# Patient Record
Sex: Male | Born: 1995 | Hispanic: No | Marital: Single | Smoking: Never smoker
Health system: Southern US, Community
[De-identification: ages and names within clinical notes are randomized; demographics above are authoritative.]

## PROBLEM LIST (undated history)

## (undated) DIAGNOSIS — Z789 Other specified health status: Secondary | ICD-10-CM

## (undated) HISTORY — PX: NO PAST SURGERIES: SHX2092

## (undated) HISTORY — DX: Other specified health status: Z78.9

---
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
## 2016-01-18 ENCOUNTER — Ambulatory Visit (INDEPENDENT_AMBULATORY_CARE_PROVIDER_SITE_OTHER): Admitting: Physician Assistant

## 2016-01-18 ENCOUNTER — Encounter (INDEPENDENT_AMBULATORY_CARE_PROVIDER_SITE_OTHER): Payer: Self-pay | Admitting: Physician Assistant

## 2016-01-18 VITALS — BP 147/91 | HR 78 | Temp 97.5°F | Resp 20 | Ht 71.0 in | Wt 180.0 lb

## 2016-01-18 DIAGNOSIS — R030 Elevated blood-pressure reading, without diagnosis of hypertension: Secondary | ICD-10-CM

## 2016-01-18 DIAGNOSIS — J029 Acute pharyngitis, unspecified: Secondary | ICD-10-CM

## 2016-01-18 DIAGNOSIS — J069 Acute upper respiratory infection, unspecified: Secondary | ICD-10-CM

## 2016-01-18 LAB — POCT RAPID STREP A: Rapid Strep A Screen POCT: NEGATIVE

## 2016-01-18 NOTE — Patient Instructions (Addendum)
Self-Care for Sore Throats  Sore throats happen for many reasons, such as colds, allergies, and infections caused by viruses or bacteria. In any case, your throat becomes red and sore. Your goal for self-care is to reduce your discomfort while giving your throat a chance to heal.    Moisten and soothe your throat  Tips include the following:   Try a sip of water first thing after waking up.   Keep your throat moist by drinking6 or more glasses of clear liquids every day.   Run a cool-air humidifier in your room overnight.   Avoid cigarette smoke.   Suck on throat lozenges, cough drops, hard candy, ice chips, or frozen fruit-juice bars. Use the sugar-free versions if your diet or medical condition requires them.  Gargle to ease irritation  Gargling every hour or2 can ease irritation. Try gargling with1 of these solutions:   1/4teaspoon of salt in1/2 cup of warm water   An over-the-counter anesthetic gargle  Use medicine for more relief  Over-the-counter medicine can reduce sore throat symptoms. Ask your pharmacist if you have questions about which medicine to use:   Ease pain with anesthetic sprays. Aspirin or an aspirin substitute also helps. Remember, never give aspirin to anyone 18 or younger, or if you are alreadytaking blood thinners.   For sore throats caused by allergies, try antihistamines to block the allergic reaction.   Remember: unless a sore throat is caused by a bacterial infection, antibiotics won't help you.  Prevent future sore throats  Prevention tips include the following:   Stop smoking or reduce contact with secondhand smoke. Smoke irritates the tender throat lining.   Limit contact with pets and with allergy-causing substances, such as pollen and mold.   When you're around someone with a sore throat or cold, wash your hands often to keep viruses or bacteria from spreading.   Don't strain your vocal cords.  Call your healthcare provider  Contact your healthcare provider if  you have:   A temperature over 101F (38.3C)   White spots on the throat   Great difficulty swallowing   Trouble breathing   A skin rash   Recent exposure to someone else with strep bacteria   Severe hoarseness and swollen glands in the neck or jaw   Date Last Reviewed: 09/19/2014   2000-2016 [employer] [address], [place] [address]. All rights reserved. This information is not intended as a substitute for professional medical care. Always follow your healthcare professional's instructions.

## 2016-01-18 NOTE — Progress Notes (Signed)
Subjective:    Patient ID: Alan Palmer is a 20 y.o. male.    URI    This is a new problem. The current episode started in the past 7 days (3 days). The problem has been gradually worsening. There has been no fever (subjective fever, "hot"). Associated symptoms include congestion, coughing, rhinorrhea and a sore throat. Pertinent negatives include no chest pain, diarrhea, rash, vomiting or wheezing.       The following portions of the patient's history were reviewed and updated as appropriate: allergies, current medications, past medical history, past social history, past surgical history and problem list.    Review of Systems   Constitutional: Positive for activity change and fatigue. Negative for fever.   HENT: Positive for congestion, rhinorrhea and sore throat.    Respiratory: Positive for cough. Negative for shortness of breath and wheezing.    Cardiovascular: Negative for chest pain.   Gastrointestinal: Negative for diarrhea and vomiting.   Skin: Negative for rash.   All other systems reviewed and are negative.        Objective:    BP (!) 147/91   Pulse 78   Temp 97.5 F (36.4 C) (Oral)   Resp 20   Ht 1.803 m (5\' 11" )   Wt 81.6 kg (180 lb)   BMI 25.10 kg/m   BP elevated, reviewed, not felt to need urgent management, f/u PCP.    Physical Exam   Constitutional: He is oriented to person, place, and time. He appears well-developed and well-nourished. No distress.   HENT:   Head: Normocephalic and atraumatic.   Right Ear: Tympanic membrane normal.   Left Ear: Tympanic membrane normal.   Nose: Rhinorrhea present.   Mouth/Throat: Uvula is midline and mucous membranes are normal. Posterior oropharyngeal erythema present.   Eyes: EOM are normal. Right eye exhibits no discharge. Left eye exhibits no discharge.   Neck: Normal range of motion.   Cardiovascular: Normal rate, regular rhythm and normal heart sounds.  Exam reveals no gallop and no friction rub.    No murmur heard.  Pulmonary/Chest: Effort  normal and breath sounds normal. No respiratory distress. He has no decreased breath sounds. He has no wheezes. He has no rhonchi. He has no rales.   Musculoskeletal: Normal range of motion.   Lymphadenopathy:     He has no cervical adenopathy.   Neurological: He is alert and oriented to person, place, and time.   Skin: Skin is warm and dry. No rash noted.   Psychiatric: He has a normal mood and affect. His behavior is normal. Judgment and thought content normal.   Nursing note and vitals reviewed.          Results     Procedure Component Value Units Date/Time    POCT RAPID STREP A [[id number]]  (Normal) Collected:  01/18/16 1744    Specimen:  Throat Updated:  01/18/16 1753     POCT QC Pass     Rapid Strep A Screen POCT Negative      Comment Negative Results should be confirmed by throat Cx to confirm absence of Strep A inf.          Assessment and Plan:       Alan Palmer was seen today for uri.    Diagnoses and all orders for this visit:    Upper respiratory tract infection, unspecified type    Pharyngitis, unspecified etiology  -     POCT RAPID STREP A    Elevated blood  pressure reading    Plan: Rapid strep negative. Clinical picture negative for strep. Supportive care- Ibuprofen PRN inflammation/pain if not contraindicated, throat lozenges, chloraseptic throat spray, warm liquids, popsicles. Wash hands frequently, stay out of peoples' faces, cover cough with corner of elbow.  Discussed ER precautions with patient/guardian. Do not hesitate to go to ER with worsening, severe symptoms. Follow up with PCP or RTC if there are any new or worsening symptoms or if the symptoms are lasting longer than expected.  Patient/guardian expressed understanding and agreement with plan of care at time of discharge.  Patient Instructions       Self-Care for Sore Throats  Sore throats happen for many reasons, such as colds, allergies, and infections caused by viruses or bacteria. In any case, your throat becomes red and sore. Your goal  for self-care is to reduce your discomfort while giving your throat a chance to heal.    Moisten and soothe your throat  Tips include the following:   Try a sip of water first thing after waking up.   Keep your throat moist by drinking6 or more glasses of clear liquids every day.   Run a cool-air humidifier in your room overnight.   Avoid cigarette smoke.   Suck on throat lozenges, cough drops, hard candy, ice chips, or frozen fruit-juice bars. Use the sugar-free versions if your diet or medical condition requires them.  Gargle to ease irritation  Gargling every hour or2 can ease irritation. Try gargling with1 of these solutions:   1/4teaspoon of salt in1/2 cup of warm water   An over-the-counter anesthetic gargle  Use medicine for more relief  Over-the-counter medicine can reduce sore throat symptoms. Ask your pharmacist if you have questions about which medicine to use:   Ease pain with anesthetic sprays. Aspirin or an aspirin substitute also helps. Remember, never give aspirin to anyone 86 or younger, or if you are alreadytaking blood thinners.   For sore throats caused by allergies, try antihistamines to block the allergic reaction.   Remember: unless a sore throat is caused by a bacterial infection, antibiotics won't help you.  Prevent future sore throats  Prevention tips include the following:   Stop smoking or reduce contact with secondhand smoke. Smoke irritates the tender throat lining.   Limit contact with pets and with allergy-causing substances, such as pollen and mold.   When you're around someone with a sore throat or cold, wash your hands often to keep viruses or bacteria from spreading.   Don't strain your vocal cords.  Call your healthcare provider  Contact your healthcare provider if you have:   A temperature over 101F (38.3C)   White spots on the throat   Great difficulty swallowing   Trouble breathing   A skin rash   Recent exposure to someone else with strep  bacteria   Severe hoarseness and swollen glands in the neck or jaw   Date Last Reviewed: 09/19/2014   2000-2016 [employer] [address], [place], [place] [address]. All rights reserved. This information is not intended as a substitute for professional medical care. Always follow your healthcare professional's instructions.                  Vinson Moselle, PA  [facility]  01/18/2016  6:11 PM

## 2016-01-20 ENCOUNTER — Encounter: Payer: Self-pay | Admitting: Emergency Medicine

## 2016-01-20 ENCOUNTER — Emergency Department

## 2016-01-20 ENCOUNTER — Emergency Department
Admission: EM | Admit: 2016-01-20 | Discharge: 2016-01-20 | Disposition: A | Discharge status: Home / Self Care | Attending: Student in an Organized Health Care Education/Training Program | Admitting: Student in an Organized Health Care Education/Training Program

## 2016-01-20 DIAGNOSIS — Y999 Unspecified external cause status: Secondary | ICD-10-CM | POA: Insufficient documentation

## 2016-01-20 DIAGNOSIS — W1839XA Other fall on same level, initial encounter: Secondary | ICD-10-CM | POA: Diagnosis not present

## 2016-01-20 DIAGNOSIS — S53124A Posterior dislocation of right ulnohumeral joint, initial encounter: Secondary | ICD-10-CM

## 2016-01-20 DIAGNOSIS — Y929 Unspecified place or not applicable: Secondary | ICD-10-CM | POA: Diagnosis not present

## 2016-01-20 DIAGNOSIS — S59901A Unspecified injury of right elbow, initial encounter: Secondary | ICD-10-CM | POA: Diagnosis present

## 2016-01-20 DIAGNOSIS — Y9363 Activity, rugby: Secondary | ICD-10-CM | POA: Diagnosis not present

## 2016-01-20 MED ORDER — FENTANYL CITRATE (PF) 100 MCG/2ML IJ SOLN
INTRAMUSCULAR | Status: AC
  Filled 2016-01-20: qty 2

## 2016-01-20 MED ORDER — PROPOFOL 10 MG/ML IV BOLUS: 100.0000 mg | Freq: Once | INTRAVENOUS | Status: DC

## 2016-01-20 MED ORDER — MORPHINE SULFATE (PF) 4 MG/ML IV SOLN
4.0000 mg | INTRAVENOUS | Status: DC | PRN
  Administered 2016-01-20: 4 mg via INTRAVENOUS
  Filled 2016-01-20: qty 1

## 2016-01-20 MED ORDER — MORPHINE SULFATE (PF) 4 MG/ML IV SOLN
4.0000 mg | Freq: Once | INTRAVENOUS | Status: AC
  Administered 2016-01-20: 4 mg via INTRAVENOUS

## 2016-01-20 MED ORDER — HYDROCODONE-ACETAMINOPHEN 5-325 MG PO TABS: 1.0000 | ORAL_TABLET | Freq: Four times a day (QID) | ORAL | 0 refills | Status: AC | PRN

## 2016-01-20 MED ORDER — KETAMINE HCL 10 MG/ML IJ SOLN: 100.0000 mg | Freq: Once | INTRAMUSCULAR | Status: DC

## 2016-01-20 MED ORDER — KETAMINE HCL 10 MG/ML IJ SOLN
INTRAMUSCULAR | Status: AC | PRN
  Administered 2016-01-20: 50 mg via INTRAVENOUS

## 2016-01-20 MED ORDER — KETAMINE HCL 10 MG/ML IJ SOLN
0.3000 mg/kg | Freq: Once | INTRAMUSCULAR | Status: AC
  Administered 2016-01-20: 24 mg via INTRAVENOUS

## 2016-01-20 MED ORDER — PROPOFOL 10 MG/ML IV BOLUS
INTRAVENOUS | Status: AC | PRN
  Administered 2016-01-20: 50 mg via INTRAVENOUS

## 2016-01-20 MED ORDER — FENTANYL CITRATE (PF) 100 MCG/2ML IJ SOLN
100.0000 ug | INTRAMUSCULAR | Status: DC | PRN
  Administered 2016-01-20: 100 ug via INTRAVENOUS

## 2016-01-20 MED ORDER — KETAMINE HCL 10 MG/ML IJ SOLN
INTRAMUSCULAR | Status: AC
  Administered 2016-01-20: 24 mg via INTRAVENOUS
  Filled 2016-01-20: qty 1

## 2016-01-20 MED ORDER — PROPOFOL 1000 MG/100ML IV EMUL
INTRAVENOUS | Status: AC
  Filled 2016-01-20: qty 100

## 2016-01-20 MED ORDER — SODIUM CHLORIDE 0.9 % IV SOLN
INTRAVENOUS | Status: AC | PRN
  Administered 2016-01-20: 125 mL/h via INTRAVENOUS

## 2016-01-20 MED ORDER — MORPHINE SULFATE (PF) 4 MG/ML IV SOLN
INTRAVENOUS | Status: AC
  Filled 2016-01-20: qty 1

## 2016-01-20 NOTE — ED Provider Notes (Signed)
[facility] Emergency Department Provider Note    First MD Initiated Contact with Patient 01/20/16 1515     (approximate)  I have reviewed the triage vital signs and the nursing notes.   HISTORY  Chief Complaint Elbow Injury    HPI Newt Lukesatrick Blais is a 20 y.o. male  who presents with chief complaint of acute right elbow pain sustained while playing rugby. Patient states he fell onto the his right elbow after he was trying to break a tackle. Patient was assessed by trainer on the field was told that his elbow is dislocated. They attempted in the field were unsuccessful. Patient denies any numbness or tingling. He is able to move all 5 digits of his right hand. He is right-hand-dominant. No other associated traumatic injuries.   History reviewed. No pertinent past medical history. No family h/o bleeding disorders History reviewed. No pertinent surgical history. There are no active problems to display for this patient.     Prior to Admission medications   Medication Sig Start Date End Date Taking? Authorizing Provider  HYDROcodone-acetaminophen (NORCO) 5-325 MG tablet Take 1 tablet by mouth every 6 (six) hours as needed for moderate pain. 01/20/16   Willy EddyPatrick Cristy Colmenares, MD    Allergies Fish allergy; Peanut-containing drug products; and Shellfish allergy    Social History Social History  Substance Use Topics  . Smoking status: Never Smoker  . Smokeless tobacco: Never Used  . Alcohol use No    Review of Systems Patient denies headaches, rhinorrhea, blurry vision, numbness, shortness of breath, chest pain, edema, cough, abdominal pain, nausea, vomiting, diarrhea, dysuria, fevers, rashes or hallucinations unless otherwise stated above in HPI. ____________________________________________   PHYSICAL EXAM:  VITAL SIGNS: Vitals:   01/20/16 1630 01/20/16 1726  BP: 137/80 (!) 144/82  Pulse: 77 72  Resp: 18 20  Temp:      Constitutional: Alert and  oriented.Eyes: Conjunctivae are normal. PERRL. EOMI. Head: Atraumatic. Nose: No congestion/rhinnorhea. Mouth/Throat: Mucous membranes are moist.  Oropharynx non-erythematous. Neck: No stridor. Painless ROM. No cervical spine tenderness to palpation Hematological/Lymphatic/Immunilogical: No cervical lymphadenopathy. Cardiovascular: Normal rate, regular rhythm. Grossly normal heart sounds.  Good peripheral circulation. Respiratory: Normal respiratory effort.  No retractions. Lungs CTAB. Gastrointestinal: Soft and nontender. No distention. No abdominal bruits. No CVA tenderness. Musculoskeletal: No lower extremity tenderness nor edema.  No joint effusions.  RUE with obvious deformity and swelling to right elbow.  SILT distally.  Neuro intact in radial, ulnar and median nerve distrubition,.  Strong radial pulse Neurologic:  Normal speech and language. No gross focal neurologic deficits are appreciated. No gait instability. Skin:  Skin is warm, dry and intact. No rash noted. Psychiatric: Mood and affect are normal. Speech and behavior are normal.  ____________________________________________   LABS (all labs ordered are listed, but only abnormal results are displayed)  No results found for this or any previous visit (from the past 24 hour(s)). ____________________________________________  EKG ____________________________________________  RADIOLOGY  I personally reviewed all radiographic images ordered to evaluate for the above acute complaints and reviewed radiology reports and findings.  These findings were personally discussed with the patient.  Please see medical record for radiology report. ____________________________________________   PROCEDURES  Procedure(s) performed: none .Sedation Date/Time: 01/20/2016 4:19 PM Performed by: Willy EddyOBINSON, Saladin Authorized by: Willy EddyOBINSON, Saeed   Consent:    Consent obtained:  Written   Consent given by:  Patient Indications:    Procedure  performed:  Dislocation reduction   Procedure necessitating sedation performed by:  Physician performing sedation   Intended level of sedation:  Moderate (conscious sedation) Pre-sedation assessment:    NPO status caution: urgency dictates proceeding with non-ideal NPO status     ASA classification: class 1 - normal, healthy patient     Neck mobility: normal     Mouth opening:  3 or more finger widths   Thyromental distance:  4 finger widths   Mallampati score:  I - soft palate, uvula, fauces, pillars visible   History of difficult intubation: no   Immediate pre-procedure details:    Reviewed: vital signs, relevant labs/tests and NPO status     Verified: bag valve mask available, emergency equipment available, intubation equipment available, IV patency confirmed, oxygen available, reversal medications available and suction available   Procedure details (see MAR for exact dosages):    Preoxygenation:  Nasal cannula   Sedation: ketofol. Post-procedure details:    Attendance: Constant attendance by certified staff until patient recovered     Recovery: Patient returned to pre-procedure baseline     Patient is stable for discharge or admission: yes     Patient tolerance:  Tolerated well, no immediate complications ORTHOPEDIC INJURY TREATMENT Date/Time: 01/20/2016 4:21 PM Performed by: Willy Eddy Authorized by: Willy Eddy  Consent: Verbal consent obtained. Consent given by: patient Patient understanding: patient states understanding of the procedure being performed Patient consent: the patient's understanding of the procedure matches consent given Procedure consent: procedure consent matches procedure scheduled Relevant documents: relevant documents present and verified Site marked: the operative site was marked Imaging studies: imaging studies available Required items: required blood products, implants, devices, and special equipment available Patient identity confirmed:  arm band Injury location: elbow Location details: right elbow Injury type: dislocation Pre-procedure neurovascular assessment: neurovascularly intact Pre-procedure distal perfusion: normal Pre-procedure neurological function: normal Pre-procedure range of motion: reduced  Anesthesia: Local anesthesia used: no  Sedation: Patient sedated: yes Manipulation performed: yes Reduction method: traction and counter traction, supination and manipulation of proximal ulna Reduction successful: yes X-ray confirmed reduction: yes Immobilization: splint and sling Post-procedure neurovascular assessment: post-procedure neurovascularly intact Post-procedure distal perfusion: normal Post-procedure neurological function: normal Post-procedure range of motion: normal Patient tolerance: Patient tolerated the procedure well with no immediate complications       Critical Care performed: no ____________________________________________   INITIAL IMPRESSION / ASSESSMENT AND PLAN / ED COURSE  Pertinent labs & imaging results that were available during my care of the patient were reviewed by me and considered in my medical decision making (see chart for details).  DDX: fracutre, dislocation, sprain  Othmar Ringer is a 20 y.o. who presents to the ED with acute deformity and pain to the right elbow after her rugby injury. Patient otherwise afebrile hemodynamically stable.  Remainder of trauma secondary survey shows no evidence and similar associated traumatic injury. X-ray of the right elbow shows posterior dislocation. Plan procedural sedation with reduction.   Clinical Course as of Jan 20 1756  Sat Jan 20, 2016  1642 POst reduction films show appropriate anatomic alignment.  Patient has a monitor post sedation and is currently stable for discharge home. He is tolerating oral hydration. Remains in no acute distress.  Have discussed with the patient and available family all diagnostics and  treatments performed thus far and all questions were answered to the best of my ability. The patient demonstrates understanding and agreement with plan.   [PR]    Clinical Course User Index [PR] Willy Eddy, MD     ____________________________________________   FINAL CLINICAL  IMPRESSION(S) / ED DIAGNOSES  Final diagnoses:  Closed traumatic dislocation of posterior elbow joint, right, initial encounter      NEW MEDICATIONS STARTED DURING THIS VISIT:  Discharge Medication List as of 01/20/2016  4:46 PM    START taking these medications   Details  HYDROcodone-acetaminophen (NORCO) 5-325 MG tablet Take 1 tablet by mouth every 6 (six) hours as needed for moderate pain., Starting Sat 01/20/2016, Print         Note:  This document was prepared using Dragon voice recognition software and may include unintentional dictation errors.    Willy EddyPatrick Allaya Abbasi, MD 01/20/16 [phone]

## 2016-01-20 NOTE — Sedation Documentation (Signed)
Patient awake and alert interacting with parents appropriately. VSS. NAD noted. Will continue to monitor.

## 2016-01-20 NOTE — Discharge Instructions (Signed)
Keep splint on until seen by your physician or the orthopedic physician As directed. Elevate for swelling and pain. Keep splint material dry. Take medications as needed for pain. Return for severe intractable pain or other concerns. ° °

## 2016-01-20 NOTE — Sedation Documentation (Signed)
Family updated as to patient's status.

## 2016-01-20 NOTE — Sedation Documentation (Signed)
MD completed reduction. Splinting elbow at this time.

## 2016-01-20 NOTE — ED Triage Notes (Signed)
While playing rugby, patient fell and injured right elbow.  Seen by trainer at the field and elbow attempted to reduce right elbow (elbow dislocated) attempt unsuccessful, sent to ED.

## 2016-01-22 ENCOUNTER — Telehealth (INDEPENDENT_AMBULATORY_CARE_PROVIDER_SITE_OTHER): Payer: Self-pay

## 2016-01-22 NOTE — Telephone Encounter (Signed)
Mailbox is full - unable to leave a message. 

## 2016-03-28 ENCOUNTER — Ambulatory Visit
Admission: RE | Admit: 2016-03-28 | Discharge: 2016-03-28 | Disposition: A | Discharge status: Home / Self Care | Source: Ambulatory Visit | Attending: Physician Assistant | Admitting: Physician Assistant

## 2016-03-28 DIAGNOSIS — M25621 Stiffness of right elbow, not elsewhere classified: Secondary | ICD-10-CM | POA: Insufficient documentation

## 2016-03-28 DIAGNOSIS — S43004D Unspecified dislocation of right shoulder joint, subsequent encounter: Secondary | ICD-10-CM | POA: Insufficient documentation

## 2016-03-28 DIAGNOSIS — M25521 Pain in right elbow: Secondary | ICD-10-CM | POA: Insufficient documentation

## 2016-04-18 ENCOUNTER — Ambulatory Visit
Admission: RE | Admit: 2016-04-18 | Discharge: 2016-04-18 | Disposition: A | Discharge status: Home / Self Care | Source: Ambulatory Visit | Attending: Physician Assistant | Admitting: Physician Assistant

## 2016-04-18 DIAGNOSIS — M25621 Stiffness of right elbow, not elsewhere classified: Secondary | ICD-10-CM | POA: Insufficient documentation

## 2016-04-18 DIAGNOSIS — M25521 Pain in right elbow: Secondary | ICD-10-CM | POA: Insufficient documentation

## 2016-04-18 DIAGNOSIS — S53104D Unspecified dislocation of right ulnohumeral joint, subsequent encounter: Secondary | ICD-10-CM | POA: Insufficient documentation

## 2016-05-19 ENCOUNTER — Ambulatory Visit

## 2017-05-23 ENCOUNTER — Encounter (INDEPENDENT_AMBULATORY_CARE_PROVIDER_SITE_OTHER): Payer: Self-pay

## 2017-05-23 ENCOUNTER — Ambulatory Visit (INDEPENDENT_AMBULATORY_CARE_PROVIDER_SITE_OTHER)
Admission: RE | Admit: 2017-05-23 | Discharge: 2017-05-23 | Disposition: A | Discharge status: Home / Self Care | Source: Ambulatory Visit | Attending: Family Medicine | Admitting: Family Medicine

## 2017-05-23 ENCOUNTER — Ambulatory Visit (INDEPENDENT_AMBULATORY_CARE_PROVIDER_SITE_OTHER): Admitting: Family Medicine

## 2017-05-23 VITALS — BP 147/81 | HR 62 | Temp 98.6°F | Resp 16 | Ht 71.0 in | Wt 180.0 lb

## 2017-05-23 DIAGNOSIS — Y9363 Activity, rugby: Secondary | ICD-10-CM

## 2017-05-23 DIAGNOSIS — S90211A Contusion of right great toe with damage to nail, initial encounter: Secondary | ICD-10-CM

## 2017-05-23 DIAGNOSIS — W500XXA Accidental hit or strike by another person, initial encounter: Secondary | ICD-10-CM

## 2017-05-23 DIAGNOSIS — Y998 Other external cause status: Secondary | ICD-10-CM

## 2017-05-23 DIAGNOSIS — S99921A Unspecified injury of right foot, initial encounter: Secondary | ICD-10-CM

## 2017-05-23 DIAGNOSIS — Y9289 Other specified places as the place of occurrence of the external cause: Secondary | ICD-10-CM

## 2017-05-23 NOTE — Progress Notes (Signed)
Subjective:    Patient ID: Efton Thomley is a 22 y.o. male.    HPI  Pt with c/o right great toe injury. Was playing Rugby and got stepped on by another players cleat. Occurred yesterday. Constant pain since. No numbness/tingling, weakness, radiation of the pain. No prior injury to the same toe. Has no tried anything to help.     The following portions of the patient's history were reviewed and updated as appropriate: allergies, current medications, past medical history, past social history, past surgical history and problem list.    Review of Systems   Constitutional: Negative for fever.   Musculoskeletal:        Right toe injury    Skin: Positive for wound.   Neurological: Negative for numbness.         Objective:    BP 147/81   Pulse 62   Temp 98.6 F (37 C) (Oral)   Resp 16   Ht 1.803 m (5\' 11" )   Wt 81.6 kg (180 lb)   BMI 25.10 kg/m     Physical Exam   Constitutional: He is oriented to person, place, and time. He appears well-developed and well-nourished. No distress.   HENT:   Head: Normocephalic and atraumatic.   Eyes: EOM are normal.   Neck: Normal range of motion.   Cardiovascular: Normal rate.    Pulmonary/Chest: Effort normal.   Musculoskeletal:        Right foot: There is decreased range of motion, tenderness, bony tenderness and swelling. There is no deformity and no laceration.        Feet:    Subungual hematoma noted    Neurological: He is alert and oriented to person, place, and time.   Skin: Skin is warm. He is not diaphoretic.   Psychiatric: He has a normal mood and affect.   Nursing note and vitals reviewed.    Lab Results from today's visit:  No results found for this or any previous visit (from the past 4 hour(s)).    Radiology Results from today's visit:  X-ray Toe Right Ap/lateral/oblique    Result Date: 05/23/2017  1.  No acute fracture is identified.  If symptoms persist, consider repeating the study in 10-14 days to assess for currently radiographically occult fracture.  ReadingStation:[id number]          Assessment and Plan:       Gid was seen today for toe injury.    Diagnoses and all orders for this visit:    Toe injury, right, initial encounter  -     X-ray Toe Right AP/Lateral/Oblique    Contusion of right great toe with damage to nail, initial encounter    Subungual hematoma of great toe of right foot, initial encounter  Nail trephination performed using electrocautery pen; ~2 cc of blood noted. Pt tolerated well, no complications. Felt better after procedure.   Wound dressed and wound care discussed    RICE management discussed   Follow up with PCP or RTC if there are any new or worsening symptoms or if the symptoms are lasting longer than expected.  Patient/guardian expressed understanding and agreement with plan of care at time of discharge.            Rito Ehrlich, MD  [facility]  05/23/2017  6:05 PM

## 2017-05-23 NOTE — Patient Instructions (Addendum)
Lower Extremity Contusion  You have a contusion (bruise) of a lower extremity (leg, knee, ankle, foot, or toe). Symptoms include pain, swelling, and skin discoloration. No bones are broken. This injury may take from a few days to a few weeks to heal. During that time, the bruise may change from reddish in color, to purple-blue, to green-yellow, to yellow-brown.  Home care   Unless another medicine was prescribed, you can take acetaminophen, ibuprofen, or naproxento control pain. (If you have chronic liver or kidney disease or ever had a stomach ulcer or gastrointestinal bleeding, talk with your doctor before using these medicines.)   Elevate the injured area to reduce pain and swelling.As much as possible, sit or lie down with the injured area raised about the level of your heart. This is especially important during the first 48 hours.   Ice the injured area to help reduce pain and swelling.Wrap a cold source (ice packor ice cubes in a plastic bag) in a thin towel. Apply to the bruised area for 20 minutes every 1 to 2 hours the first day. Continue this 3 to 4 times a day until the pain and swelling goes away.   If crutches have been advised, do not bear full weight on the injured leg until you can do so without pain. You may return to sports when you are able to put full weight and impact on the injured leg without pain.  Follow up  Follow up with your healthcare provider or our staff as advised. Call if you are not improving within the next1 to 2 weeks.  When to seek medical advice  Call your healthcare provider right away if any of these occur:   Increased pain or swelling   Foot or toes becomecold, blue, numb or tingly   Signs of infection:Warmth, drainage, or increased redness or pain around the injury   Inability to move the injured area   Frequent bruising for unknown reasons  Date Last Reviewed: 03/22/2015   2000-2018 [employer] [address], [place] [address].  All rights reserved. This information is not intended as a substitute for professional medical care. Always follow your healthcare professional's instructions.

## 2017-05-26 ENCOUNTER — Telehealth (INDEPENDENT_AMBULATORY_CARE_PROVIDER_SITE_OTHER): Payer: Self-pay

## 2017-05-26 NOTE — Telephone Encounter (Signed)
Called to check on patient after recent visit.    Spoke with patient states he is doing good. No further questions at this time.

## 2017-07-21 ENCOUNTER — Ambulatory Visit (INDEPENDENT_AMBULATORY_CARE_PROVIDER_SITE_OTHER): Admitting: Physician Assistant

## 2017-07-21 ENCOUNTER — Encounter (INDEPENDENT_AMBULATORY_CARE_PROVIDER_SITE_OTHER): Payer: Self-pay | Admitting: Physician Assistant

## 2017-07-21 ENCOUNTER — Ambulatory Visit (INDEPENDENT_AMBULATORY_CARE_PROVIDER_SITE_OTHER)
Admission: RE | Admit: 2017-07-21 | Discharge: 2017-07-21 | Disposition: A | Discharge status: Home / Self Care | Source: Ambulatory Visit | Attending: Physician Assistant | Admitting: Physician Assistant

## 2017-07-21 VITALS — BP 142/100 | HR 62 | Temp 98.6°F | Resp 16 | Ht 71.0 in | Wt 180.0 lb

## 2017-07-21 DIAGNOSIS — Y998 Other external cause status: Secondary | ICD-10-CM

## 2017-07-21 DIAGNOSIS — X58XXXA Exposure to other specified factors, initial encounter: Secondary | ICD-10-CM

## 2017-07-21 DIAGNOSIS — Y9363 Activity, rugby: Secondary | ICD-10-CM

## 2017-07-21 DIAGNOSIS — M542 Cervicalgia: Secondary | ICD-10-CM

## 2017-07-21 DIAGNOSIS — Y9289 Other specified places as the place of occurrence of the external cause: Secondary | ICD-10-CM

## 2017-07-21 MED ORDER — CYCLOBENZAPRINE HCL 5 MG PO TABS
5.00 mg | ORAL_TABLET | Freq: Three times a day (TID) | ORAL | 0 refills | Status: DC | PRN
Start: 2017-07-21 — End: 2017-11-26

## 2017-07-21 NOTE — Progress Notes (Signed)
Subjective:    Patient ID: Alan Palmer is a 22 y.o. male.    Patient presents with neck pain and stiffness x 2 weeks. Patient is active in rugby and was involved in a tackle two weeks ago where instead of proper head placement at the opponents hip, he hit the opponent in the center of the abdomin and "wrenched" his neck. He continued to play, both in games and practice, and his neck pain has remained constant during this time period. The athletic trainers were hesitant to allow him to play this past weekend, but ended up allowing the play. He was seen by primary last week who requested a neck XR, patient did not follow through on this order. He has been taking advil 2-3 times per day for symptoms with minimal relief. Patient does state that pain will radiate down his spine when he tackles, but it does not radiate down his arms.     Patient denies LOC, shortness of breath, concussion, numbness/loss of strength in the upper body, loss of bowel/bladder control, numbness in inner thighs/groin area.       The following portions of the patient's history were reviewed and updated as appropriate: allergies, current medications, past family history, past medical history, past social history, past surgical history and problem list.    Review of Systems   Constitutional: Positive for activity change and fatigue. Negative for appetite change, chills and fever.   Respiratory: Negative for shortness of breath.    Cardiovascular: Negative for chest pain.   Musculoskeletal: Positive for back pain, neck pain and neck stiffness.   Skin: Negative for rash.   Neurological: Positive for headaches. Negative for dizziness, syncope, weakness, light-headedness and numbness.         Objective:    BP (!) 142/100   Pulse 62   Temp 98.6 F (37 C) (Oral)   Resp 16   Ht 1.803 m (5\' 11" )   Wt 81.6 kg (180 lb)   BMI 25.10 kg/m     Physical Exam   Constitutional: He is oriented to person, place, and time. He appears well-developed  and well-nourished. He is active.   HENT:   Right Ear: External ear normal.   Left Ear: External ear normal.   Mouth/Throat: Oropharynx is clear and moist.   Eyes: Pupils are equal, round, and reactive to light. EOM are normal.   Neck: Spinous process tenderness and muscular tenderness present. No neck rigidity. Decreased range of motion present. No tracheal deviation, no edema and no erythema present.   Decreased AROM (full flexion/extension, decreased rotation -left: 70 degrees, - right: 80 degrees, side bend 30 degrees bilaterally). Pain with palpation to C6/C7. Pain with palpation to bilateral trapezius and paraspinals. Negative Spurling Maneuver.    Cardiovascular: Normal rate, regular rhythm and normal heart sounds.    Pulmonary/Chest: Effort normal and breath sounds normal.   Lymphadenopathy:     He has no cervical adenopathy.   Neurological: He is alert and oriented to person, place, and time. He has normal strength and normal reflexes. He displays normal reflexes. No cranial nerve deficit or sensory deficit.   Skin: Skin is warm and dry. No rash noted. No erythema.   Nursing note and vitals reviewed.        Assessment and Plan:       Simeon was seen today for neck pain.    Diagnoses and all orders for this visit:    Acute neck pain  -  XR Cervical Spine 4 Or 5 Views  -     cyclobenzaprine (FLEXERIL) 5 MG tablet; Take 1 tablet (5 mg total) by mouth 3 (three) times daily as needed for Muscle spasms Please take 1-2 tabs TID prn    Lab Results from today's visit:  No results found for this or any previous visit (from the past 4 hour(s)).    Radiology Results from today's visit:  Xr Cervical Spine 4 Or 5 Views    Result Date: 07/21/2017  Straightening of the cervical lordosis. No sign of acute injury ReadingStation:[id number]    Discussed with patient the cause of the neck pain and the meaning behind "Straitening of Cervical Lordosis". Flexeril being given to allow muscles to relax and reduce symptoms, patient  instructed not to use this medication if he is participating in an activity or driving as it can make him drowsy.  Patient advised not to consume alcohol while taking this medication.     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     Patient advised to RTC/ED if he has trouble breathing, confusion, weakness/numbness in either arm or leg, fever.       Patient Instructions     General Neck and Back Pain    Both neck and back pain are usually caused by injury to the muscles or ligaments of the spine. Sometimes the disks that separate each bone of the spine may cause pain by pressing on a nearby nerve. Back and neck pain may appear after a sudden twisting or bending force (such as in a car accident), or sometimes after a simple awkward movement. In either case, muscle spasm is often present and adds to the pain.  Acute neck and back pain usually gets better in 1 to 2 weeks. Pain related to disk disease, arthritis in the spinal joints or spinal stenosis (narrowing of the spinal canal) can become chronic and last for months or years.  Back and neck pain are common problems. Most people feel better in 1 or 2 weeks, and most of the rest in 1 to 2 months. Most people can remain active.  People have anddescribe pain differently.   Pain can be sharp, stabbing, shooting, aching, cramping, or burning   Movement, standing, bending, lifting, sitting, or walking may worsen the pain   Pain can be localized to one spot or area, or it can be more generalized   Pain can spread or radiate upwards, downwards, to the front, or go down your arms   Muscle spasm may occur.  Most of the time mechanical problems with the muscles or spine cause the pain. it is usually caused by an injury, whether known or not, to  the muscles or ligaments. While illnesses can cause back pain, it is usually not caused by a serious illness. Pain is usually related to physical activity, whether sports, exercise, work, or normal activity. Sometimes it can occur without an identifiable cause. This can happen simply by stretching or moving wrong, without noting pain at the time. Other causes include:   Overexertion, lifting, pushing, pulling incorrectly or too aggressively.   Sudden twisting, bending or stretching from an accident (car or fall), or accidental movement.   Poor posture   Poor conditioning, lack of regular exercise  Spinal disc disease or arthritis   Stress   Pregnancy, or illness like appendicitis, bladder or kidney infection, pelvic infections  Home care   [facility] pain:Use a comfortable pillow that supports the head and keeps the spine in a neutral position. The position of the head should not be tilted forward or backward.   When in bed, try to find a position of comfort. A firm mattress is best. Try lying flat on your back with pillows under your knees. You can also try lying on your side with your knees bent up towards your chest and a pillow between your knees.   At first, do not try to stretch out the sore spots. If there is a strain, it is not like the good soreness you get after exercising without an injury. In this case, stretching may make it worse.   Don't sit for long periods, as inlong car rides orother travel. This puts more stress on the lower back than standing or walking.   During the first 24 to 72 hours after an injury, apply an ice pack to the painful area for 20 minutes and then remove it for 20 minutes over a period of 60 to 90 minutes or several times a day.   You can alternate ice and heat therapies. Talk with your healthcare provider about the best treatment for your back or neck pain. As a safety precaution, do not use a heating pad at bedtime. Sleeping with a heating pad can lead to skin  burns or tissue damage.   Therapeutic massage can help relax the back and neck muscles without stretching them.   Be aware of safe lifting methods and do not lift anything over 15 pounds until all the pain is gone.  Medicines  Talk to your healthcare provider before using medicine, especially if you have other medical problems or are taking other medicines.   You may use over-the-counter medicine to control pain, unless another pain medicine was prescribed. If you have chronic conditions like diabetes, liver or kidney disease, stomach ulcers, gastrointestinal bleeding, or are taking blood thinner medicines.   Be careful if you are given pain medicines, narcotics, or medicine for muscle spasm. They can cause drowsiness, and can affect your coordination, reflexes, and judgment. Do not drive or operate heavy machinery.  Follow-up care  Follow up with your healthcare provider, or as advised. Physical therapy or further tests may be needed.  If X-rays were taken, you will be notified of any new findings that may affect your care.  Call 911  Call 911 if any of the following occur:   Trouble breathing   Confusion   Very drowsy or trouble awakening   Fainting or loss of consciousness   Rapid or very slow heart rate   Loss of bowel or bladder control  When to seek medical advice  Call your healthcare provider right away if any of these occur:   Pain becomes worse or spreads into your arms or legs   Weakness, numbness or pain in one or both arms or legs   Numbness in the groin area   Difficulty walking   Fever of 100.68F (38C) or higher, or as directed by your healthcare provider  Date Last Reviewed: 08/19/2014   2000-2018 [employer], [place]. [address] [place], [place] [address]. All rights reserved. This information is not intended as a substitute for professional medical care. Always follow your healthcare professional's instructions.  Grier Mitts, PA  [facility]   07/21/2017  7:30 PM

## 2017-07-21 NOTE — Patient Instructions (Addendum)
General Neck and Back Pain    Both neck and back pain are usually caused by injury to the muscles or ligaments of the spine. Sometimes the disks that separate each bone of the spine may cause pain by pressing on a nearby nerve. Back and neck pain may appear after a sudden twisting or bending force (such as in a car accident), or sometimes after a simple awkward movement. In either case, muscle spasm is often present and adds to the pain.  Acute neck and back pain usually gets better in 1 to 2 weeks. Pain related to disk disease, arthritis in the spinal joints or spinal stenosis (narrowing of the spinal canal) can become chronic and last for months or years.  Back and neck pain are common problems. Most people feel better in 1 or 2 weeks, and most of the rest in 1 to 2 months. Most people can remain active.  People have anddescribe pain differently.   Pain can be sharp, stabbing, shooting, aching, cramping, or burning   Movement, standing, bending, lifting, sitting, or walking may worsen the pain   Pain can be localized to one spot or area, or it can be more generalized   Pain can spread or radiate upwards, downwards, to the front, or go down your arms   Muscle spasm may occur.  Most of the time mechanical problems with the muscles or spine cause the pain. it is usually caused by an injury, whether known or not, to the muscles or ligaments. While illnesses can cause back pain, it is usually not caused by a serious illness. Pain is usually related to physical activity, whether sports, exercise, work, or normal activity. Sometimes it can occur without an identifiable cause. This can happen simply by stretching or moving wrong, without noting pain at the time. Other causes include:   Overexertion, lifting, pushing, pulling incorrectly or too aggressively.   Sudden twisting, bending or stretching from an accident (car or fall), or accidental movement.   Poor posture   Poor conditioning, lack of regular  exercise   Spinal disc disease or arthritis   Stress   Pregnancy, or illness like appendicitis, bladder or kidney infection, pelvic infections  Home care   Forneck pain:Use a comfortable pillow that supports the head and keeps the spine in a neutral position. The position of the head should not be tilted forward or backward.   When in bed, try to find a position of comfort. A firm mattress is best. Try lying flat on your back with pillows under your knees. You can also try lying on your side with your knees bent up towards your chest and a pillow between your knees.   At first, do not try to stretch out the sore spots. If there is a strain, it is not like the good soreness you get after exercising without an injury. In this case, stretching may make it worse.   Don't sit for long periods, as inlong car rides orother travel. This puts more stress on the lower back than standing or walking.   During the first 24 to 72 hours after an injury, apply an ice pack to the painful area for 20 minutes and then remove it for 20 minutes over a period of 60 to 90 minutes or several times a day.   You can alternate ice and heat therapies. Talk with your healthcare provider about the best treatment for your back or neck pain. As a safety precaution, do not use a   heating pad at bedtime. Sleeping with a heating pad can lead to skin burns or tissue damage.   Therapeutic massage can help relax the back and neck muscles without stretching them.   Be aware of safe lifting methods and do not lift anything over 15 pounds until all the pain is gone.  Medicines  Talk to your healthcare provider before using medicine, especially if you have other medical problems or are taking other medicines.   You may use over-the-counter medicine to control pain, unless another pain medicine was prescribed. If you have chronic conditions like diabetes, liver or kidney disease, stomach ulcers, gastrointestinal bleeding, or are taking  blood thinner medicines.   Be careful if you are given pain medicines, narcotics, or medicine for muscle spasm. They can cause drowsiness, and can affect your coordination, reflexes, and judgment. Do not drive or operate heavy machinery.  Follow-up care  Follow up with your healthcare provider, or as advised. Physical therapy or further tests may be needed.  If X-rays were taken, you will be notified of any new findings that may affect your care.  Call 911  Call 911 if any of the following occur:   Trouble breathing   Confusion   Very drowsy or trouble awakening   Fainting or loss of consciousness   Rapid or very slow heart rate   Loss of bowel or bladder control  When to seek medical advice  Call your healthcare provider right away if any of these occur:   Pain becomes worse or spreads into your arms or legs   Weakness, numbness or pain in one or both arms or legs   Numbness in the groin area   Difficulty walking   Fever of 100.4F (38C) or higher, or as directed by your healthcare provider  Date Last Reviewed: 08/19/2014   2000-2018 [employer] [address], [place] [address]. All rights reserved. This information is not intended as a substitute for professional medical care. Always follow your healthcare professional's instructions.

## 2017-07-24 ENCOUNTER — Telehealth (INDEPENDENT_AMBULATORY_CARE_PROVIDER_SITE_OTHER): Payer: Self-pay

## 2017-07-24 NOTE — Telephone Encounter (Signed)
Called to check on patient after recent visit.    Spoke with patient states he is doing good. No further questions at this time.

## 2017-07-29 IMAGING — DX DG ELBOW 2V*R*
2 series · 2 of 2 positions shown · non-contrast
Comparison: 01/20/2016 at 3393 hours

CLINICAL DATA: Status post reduction of a right elbow dislocation.

EXAM:
RIGHT ELBOW - 2 VIEW

[elbow ap]
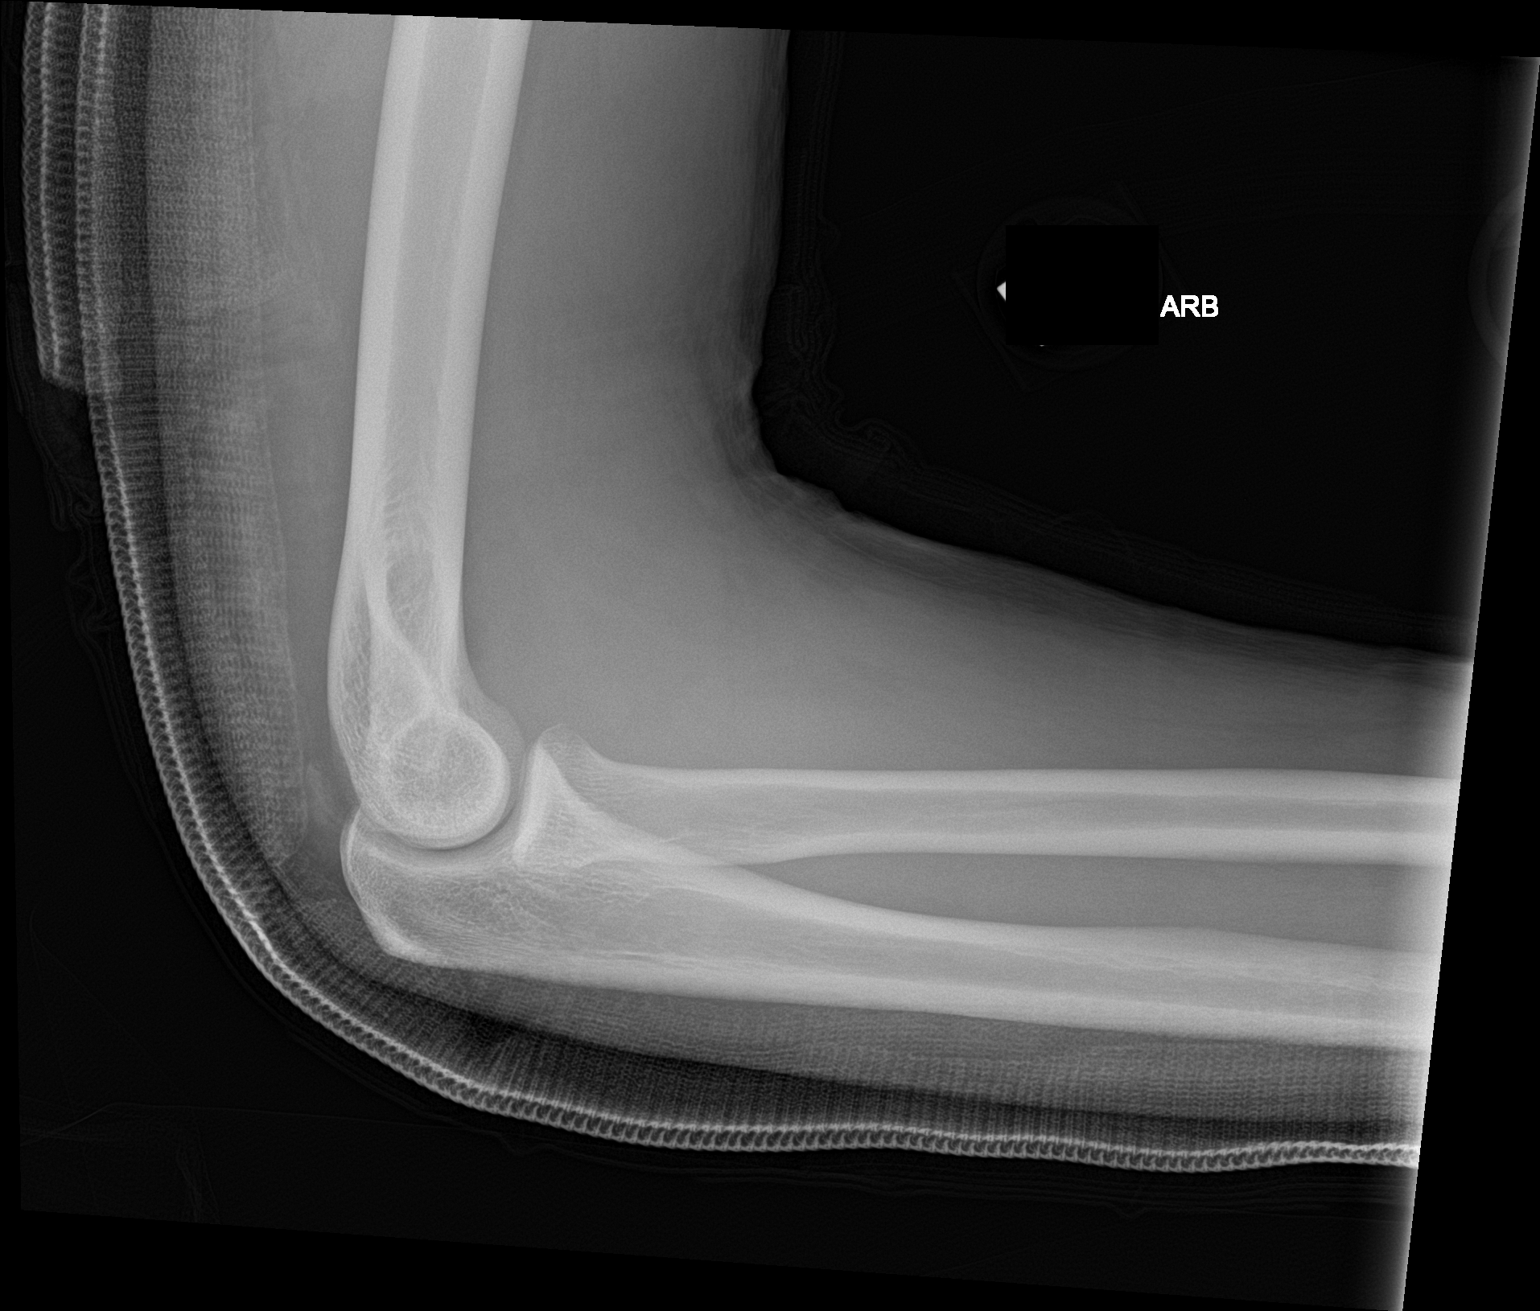

[elbow lat]
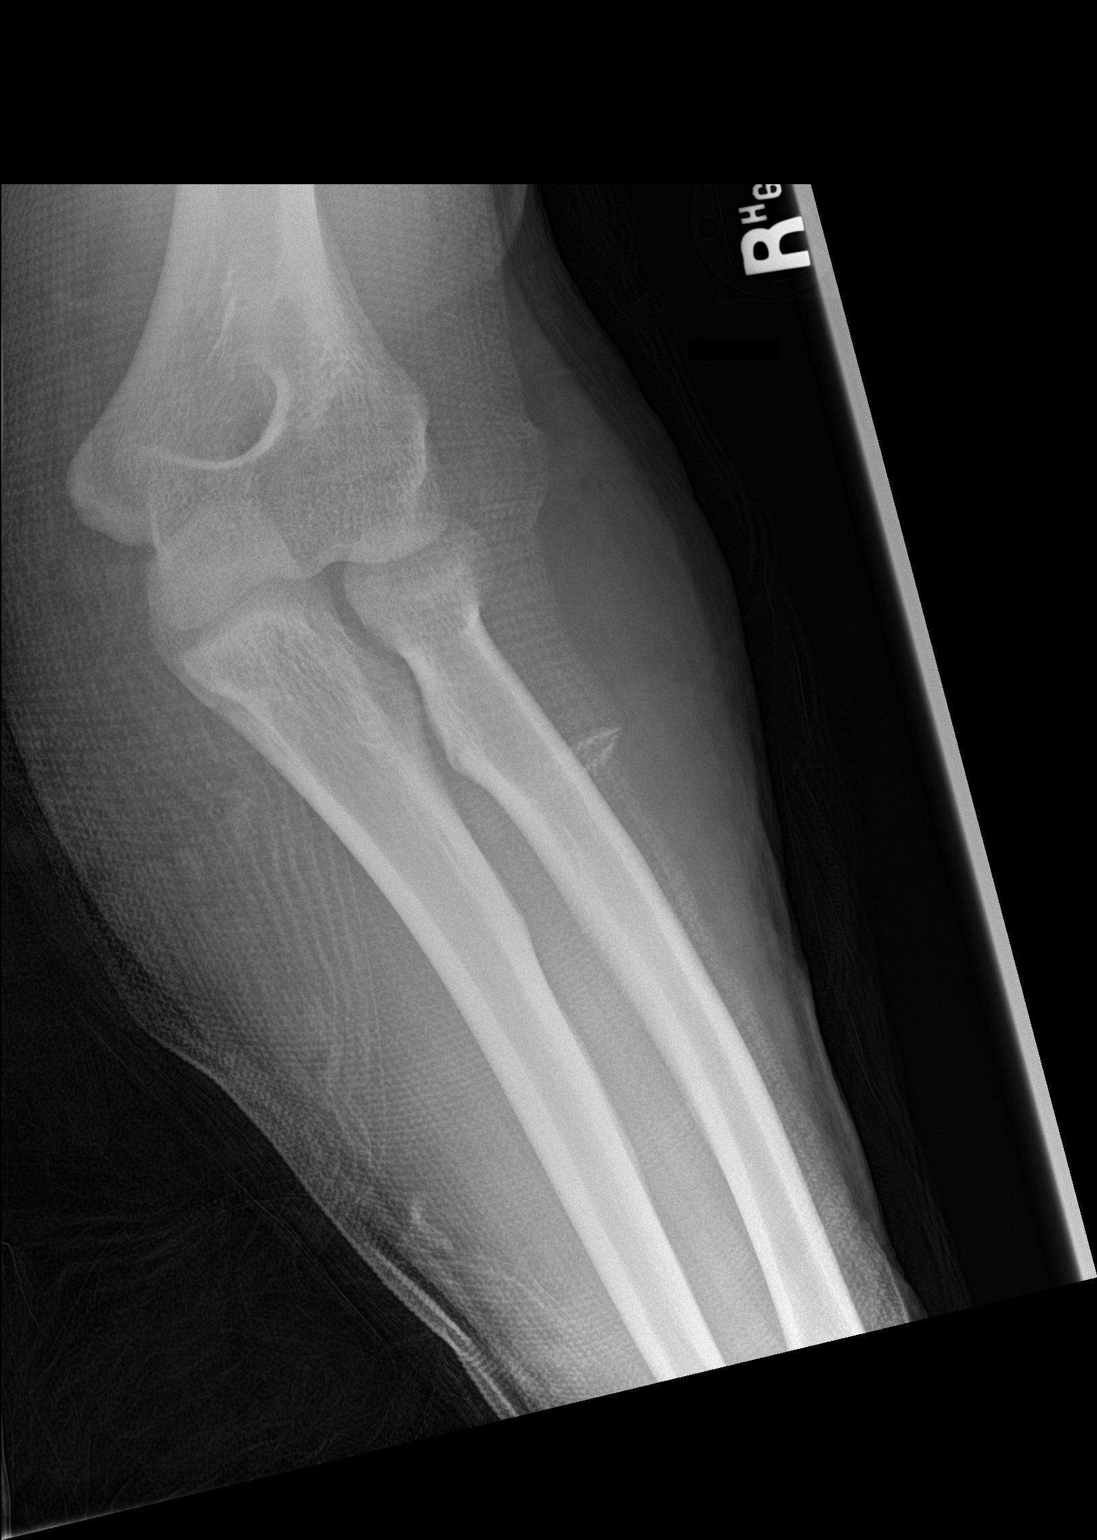

[2 of 2 positions shown; findings below may reference images not displayed]

FINDINGS: The elbow joint has been reduced into anatomic alignment. No
fracture.
IMPRESSION: Successful reduction of the right elbow dislocation.

## 2017-11-26 ENCOUNTER — Ambulatory Visit (INDEPENDENT_AMBULATORY_CARE_PROVIDER_SITE_OTHER): Admitting: Nurse Practitioner

## 2017-11-26 ENCOUNTER — Ambulatory Visit (INDEPENDENT_AMBULATORY_CARE_PROVIDER_SITE_OTHER)
Admission: RE | Admit: 2017-11-26 | Discharge: 2017-11-26 | Disposition: A | Discharge status: Home / Self Care | Source: Ambulatory Visit | Attending: Nurse Practitioner | Admitting: Nurse Practitioner

## 2017-11-26 ENCOUNTER — Encounter (INDEPENDENT_AMBULATORY_CARE_PROVIDER_SITE_OTHER): Payer: Self-pay

## 2017-11-26 VITALS — BP 129/78 | HR 60 | Temp 97.9°F | Resp 18 | Ht 71.0 in | Wt 175.0 lb

## 2017-11-26 DIAGNOSIS — F419 Anxiety disorder, unspecified: Secondary | ICD-10-CM

## 2017-11-26 DIAGNOSIS — S4992XA Unspecified injury of left shoulder and upper arm, initial encounter: Secondary | ICD-10-CM

## 2017-11-26 DIAGNOSIS — S4352XA Sprain of left acromioclavicular joint, initial encounter: Secondary | ICD-10-CM

## 2017-11-26 DIAGNOSIS — F32A Depression, unspecified: Secondary | ICD-10-CM

## 2017-11-26 DIAGNOSIS — F329 Major depressive disorder, single episode, unspecified: Secondary | ICD-10-CM

## 2017-11-26 NOTE — Patient Instructions (Addendum)
Depression  Depression is one of the most common mental health problems today. It is not just a state of unhappiness or sadness. It is a true disease. The cause seems to be related to a decrease in chemicals that transmit signals in the brain. Having a family history of depression, alcoholism, or suicide increases the risk. Chronic illness, chronic pain, migraine headaches, and high emotional stress also increase the risk.  Depression is something we tend to recognize in others, but may have a hard time seeing in ourselves. It can show in many physical and emotional ways:   Loss of appetite   Overeating   Not being able to sleep   Sleeping too much   Tiredness not related to physical exertion   Restlessness or irritability   Slowness of movement or speech   Feeling depressed or withdrawn   Loss of interest in things you once enjoyed   Troubleconcentrating, poor memory,trouble making decisions   Thoughts of harming or killing oneself, or thoughts that life is not worth living   Low self-esteem  The treatment for depression may include both medicine and psychotherapy. Antidepressants can reduce suffering and can improve the ability to function during the depressed period. Therapy can offer emotional support and help you understand emotional factors that may be causing the depression.  Home care   Ongoing care and support help people manage this disease. Find a healthcare provider and therapist who meet your needs. Seek help when you feel like you may be getting ill.   Be kind to yourself. Make it a point to do things that you enjoy (gardening, walking in nature, going to a movie). Reward yourself for small successes.   Take care of your physical body. Eat a balanced diet (low in saturated fat and high in fruits and vegetables). Exercise at least 3 times a week for 30 minutes. Even mild-moderate exercise (like brisk walking) can make you feel better.   Don't drink alcohol, which can make depression  worse.   Take medicine as prescribed.   Tell each of your healthcare providers about all of the prescription and over-the-counter medicines, vitamins, and supplements you take.Certain supplements interact with medicines and can result in dangerous side effects.Ask your pharmacist when you have questions about medicine interactions.   Talk with your family andtrusted friendsabout your feelings and thoughts.Ask them to help you recognize behavior changes early so you can get help and, if needed, medicine can be adjusted.    Follow-up care  Follow up with your healthcare provider, or as advised.  Call 911  Call 911 if you:   Have suicidal thoughts, a suicide plan, and the means to carry out the plan; or serious thoughts of hurting someone else   Have trouble breathing   Arevery confused   Feel very drowsy or havetrouble awakening   Faint or lose consciousness   Have new chest pain that becomes more severe, lasts longer, or spreads into your shoulder, arm, neck, jaw, or back  When to seek medical advice  Call your healthcare provider right away if any of these happen:   Feeling extreme depression, fear, anxiety, or anger toward yourself or others   Feeling out of control   Feeling that you may try to harm yourself or another   Hearing voices that others do not hear   Seeing things that others do not see   Can't sleep or eat for 3 days in a row   Friends or family express concern over   your behavior and ask you to seek help  Date Last Reviewed: 11/19/2015   2000-2019 [employer], [place]. [address], [place], [place] [address]. All rights reserved. This information is not intended as a substitute for professional medical care. Always follow your healthcare professional's instructions.        Understanding Anxiety Disorders  Almost everyone gets nervous now and then. It's normal to have knots in your stomach before a test, or for your heart to race on a first date. But an anxiety disorder is  much more than a case of nerves. In fact, its symptoms may be overwhelming. But treatment can relieve many of these symptoms. Talking to your healthcare provider is the first step.    What are anxiety disorders?  An anxiety disorder causes intense feelings of panic and fear. These feelings may arise for no apparent reason. And they tend to recur again and again. They may prevent you from coping with life and cause you great distress. As a result, you may avoid anything that triggers your fear. In extreme cases, you may never leave the house. Anxiety disorders may cause other symptoms, such as:   Obsessive thoughts that are unwanted and you can't control   Constant nightmares or painful thoughts of the past   Nausea, sweating, and muscle tension   Trouble sleeping or concentrating  What causes anxiety disorders?  Anxiety disorders tend to run in families. For some people, childhood abuse or neglect may play a role. For others, stressful life events or trauma may trigger anxiety disorders. Anxiety can trigger low self-esteem and poor coping skills.   Panic disorder. This causes an intense fear of being in danger.   Phobias. These are extreme fears of certain objects, places, or events.   Obsessive-compulsive disorder. This causes you to have unwanted thoughts and urges. You also may perform certain actions over and over.   Posttraumatic stress disorder. This occurs in people who have survived a terrible ordeal. It can cause nightmares and flashbacks about the event.   Generalized anxiety disorder. This causes constant worry that can greatly disrupt your life.    Getting better  You may believe that nothing can help you. Or, you might fear what others may think. But most anxiety symptoms can be eased. Having an anxiety disorder is nothing to be ashamed of. Most people do best with treatment that combines medicine and individual and group therapy. These aren't cures. But they can help you live a healthier life.   Date Last Reviewed: 03/22/2015   2000-2019 [employer], [place]. [address], [place], [place] [address]. All rights reserved. This information is not intended as a substitute for professional medical care. Always follow your healthcare professional's instructions.        AC Joint Sprain (Adult)    The [facility] or acromioclavicular joint is at the end of the collar bone, or clavicle, near the shoulder. The [facility] joint is made of 4 ligaments that hold the collar bone to the shoulder blade, or scapula. With an [facility] joint sprain, these ligaments may be partly or fully torn. In both cases, this causes pain and swelling at the end of the collar bone. If the ligaments are completely torn, the collar bone will rise up.  AC joint sprains are given a grade depending on whether they are mild, moderate, or severe:   Grade 1. A mild sprain, with minor damage to the ligaments. The collar bone stays in place.   Grade  2. A moderate sprain. The ligaments are partly torn. The collar bone is moved out of place. The injured shoulder may look lower and flatter than the other shoulder.   Grade 3. The most severe kind of sprain. The ligaments are completely torn. The collar bone is no longer joined to the shoulder blade. The collar bone rises up. This creates a bump on top of the shoulder. The ligaments heal in this position, so the bump does not go away. It is possible to have surgery to correct the bump. But normal shoulder function will usually return even without surgery.  An AC sprain will take up to 6 weeks or longer to heal, depending on how severe it is. It is often treated with a sling. Or a sling and an elastic wrap around the chest may be used.Physical therapy may be needed to help the shoulder keep full range of motion.Once healed, you can usually expect full recovery of shoulder function.  Home care   Your provider may prescribe medicines for pain. Or you may use over-the-counter pain medicines. Talk with your provider  before taking medicines if you have chronic liver or kidney disease, or have ever had a stomach ulcer or gastrointestinal bleeding.   Make an appointment right away to see your provider or an orthopedic or bone doctor, for further evaluation and treatment of the injury.   Use the injured area as little as possible. This will help decrease pain and swelling and allow the area to heal.   Place an ice pack over the injured area for15 minutes. Do this every4 to 6hoursfor the first 24 to 48 hours, or as directed. Keep using ice packs to ease pain and swelling as directed by your provider or the orthopedic doctor.   To make an ice pack, put ice cubes in a plasticbag that seals at the top. Wrap the bag in aclean, thintowel or cloth. Never put ice or an ice pack directly on the skin. The ice pack can be put right on the wrap or sling. As the ice melts, be careful to not get the wrap or sling wet.   A sling alone is often enough. Sometime a sling and a wrap around the chest may be used to help ease pain, if needed. This also helps keep your injured arm from moving. Use these devices as advised until you are seen by your healthcare provider or the orthopedic doctor. Always ask when the wrap should be worn. Always ask when the wrap can be removed.   [place]arthe sling while awake or as advised, until you are scheduled to see your healthcare provider or an orthopedic doctor.You may remove the sling to sleep. You may remove the sling to bathe. Make sure the sling is comfortable and keeps your arm raised, as advised by the provider.Follow the provider's instructions on how to use the sling. Always ask when you should wear the sling. Always ask when the sling can be removed.   Shoulder joints become stiff if they are kept still for too long. Talk with your healthcare provider or the orthopedic doctor about range of motion exercises and possible physical therapy.  Follow-up care  Follow up with your healthcare provider,  or as advised. Your provider may refer you to a specialist such as an orthopedic, or bone, doctor.  If X-rays were taken, you will be told of any new findings that may affect your care.  When to seek medical advice  Call your healthcare provider right  awayif any of these occur:   Your shoulder looks off-balance   You have increased pain, swelling, or bruising   You have increased pain even after using prescribed pain medicine   You have continuing pain   Your hand or arm becomes cold, blue, numb, or tingly   You have trouble moving your shoulder, wrist, or elbow due to stiffness  Date Last Reviewed: 06/18/2016   2000-2019 [employer], [place]. [address], [place], [place] [address]. All rights reserved. This information is not intended as a substitute for professional medical care. Always follow your healthcare professional's instructions.

## 2017-11-26 NOTE — Progress Notes (Signed)
Subjective:    Patient ID: Alan Palmer is a 22 y.o. male.    Shoulder Injury    Pertinent negatives include no chest pain or numbness.        Pt report left shoulder pain  During a rugby game, no specific injury put painful enough to leave the game; pt has been seeing trainer at school and doing one exercise of  Hanging arm to loosen muscles. Pt reports he has not been  Playing since the injury;     Pt also reports an increase in anxiety over the past week associate with HA and nausea; pt reports that he has felt anxious in the past but has never done anything; pt reports some "big things" coming up including a big D1 rugby game; pt reports it is a bunch of little things all adding up at once-; pt also reports no appetite;denies SI or HI. Pt reports he has good support system.    The following portions of the patient's history were reviewed and updated as appropriate: allergies, current medications, past medical history, past social history, past surgical history and problem list.    Review of Systems   Constitutional: Positive for fatigue. Negative for chills, fever and unexpected weight change.   HENT: Negative for ear pain and sore throat.    Respiratory: Positive for cough and chest tightness. Negative for shortness of breath.         Dry cough; chest tightness Associated with anxiety     Cardiovascular: Negative for chest pain and palpitations.   Gastrointestinal: Positive for nausea. Negative for abdominal pain, diarrhea and vomiting.   Genitourinary: Negative for dysuria, flank pain and urgency.   Musculoskeletal: Positive for arthralgias. Negative for back pain, myalgias, neck pain and neck stiffness.        Left shoulder   Skin: Negative for rash and wound.   Allergic/Immunologic: Positive for food allergies. Negative for environmental allergies.   Neurological: Positive for dizziness and headaches. Negative for tremors, weakness, light-headedness and numbness.        HA behind rt eye; occasional  migraines   Psychiatric/Behavioral: Positive for agitation and decreased concentration. Negative for behavioral problems, confusion, dysphoric mood, hallucinations, self-injury, sleep disturbance and suicidal ideas. The patient is nervous/anxious. The patient is not hyperactive.         Sleeping way too much; pt denies feeling depressed         Objective:    BP 129/78   Pulse 60   Temp 97.9 F (36.6 C) (Oral)   Resp 18   Ht 1.803 m (5\' 11" )   Wt 79.4 kg (175 lb)   BMI 24.41 kg/m     Physical Exam  Vitals signs and nursing note reviewed.   Constitutional:       General: He is not in acute distress.     Appearance: Normal appearance. He is normal weight.   HENT:      Head: Normocephalic and atraumatic.      Right Ear: Tympanic membrane, ear canal and external ear normal.      Left Ear: Tympanic membrane, ear canal and external ear normal.      Nose: Nose normal. No congestion or rhinorrhea.      Mouth/Throat:      Mouth: Mucous membranes are moist.      Pharynx: No oropharyngeal exudate or posterior oropharyngeal erythema.   Eyes:      Conjunctiva/sclera: Conjunctivae normal.      Pupils: Pupils are  equal, round, and reactive to light.   Neck:      Musculoskeletal: Normal range of motion. No neck rigidity or muscular tenderness.   Cardiovascular:      Rate and Rhythm: Regular rhythm.      Heart sounds: Normal heart sounds.   Pulmonary:      Effort: Pulmonary effort is normal.      Breath sounds: Normal breath sounds.   Abdominal:      Palpations: Abdomen is soft.      Tenderness: There is no tenderness.   Musculoskeletal: Normal range of motion.      Right shoulder: He exhibits tenderness, bony tenderness, swelling, pain, spasm and decreased strength. He exhibits normal range of motion, no effusion, no crepitus, no deformity, no laceration and normal pulse.      Comments: No gross deformity, swelling, discoloration noted. NTTP.   Full ROM and strength 4/5 throughout left arm due to "pain".   Neer's and  Hawkins-Kennedy negative.   Lift off negative.    painful arc test at 180degrees,   negattive drop arm, and empty can testing. O'brien's negative. Negative Sulcus Sign. No anterior/posterior laxity noted. Negative Spurling's testing. Sensation normal. Reflexes +2.   Pain increases with abduction and external rotation   Pain increase with adduction and internal rotation    Lymphadenopathy:      Cervical: No cervical adenopathy.   Neurological:      Mental Status: He is alert and oriented to person, place, and time.   Psychiatric:         Attention and Perception: Attention and perception normal. He is attentive. He does not perceive auditory or visual hallucinations.         Mood and Affect: Mood is anxious. Mood is not depressed or elated. Affect is not labile, blunt, flat, angry, tearful or inappropriate.         Speech: He is communicative. Speech is tangential. Speech is not rapid and pressured, delayed or slurred.         Behavior: Behavior normal. Behavior is not agitated, slowed, aggressive, withdrawn or combative.         Thought Content: Thought content normal. Thought content is not paranoid or delusional. Thought content does not include homicidal or suicidal ideation. Thought content does not include homicidal or suicidal plan.         Cognition and Memory: Cognition normal.         Judgment: Judgment normal. Judgment is not impulsive or inappropriate.      Comments: Pt seems anxious;      Lab Results from today's visit:  No results found for this or any previous visit (from the past 4 hour(s)).    Radiology Results from today's visit:  Xr Shoulder Left 2+ Views    Result Date: 11/26/2017  Normal left shoulder radiographs. ReadingStation:[id number]    PHQ-9 depression screening questionnaire:  Score of 16        Assessment and Plan:       Alan Palmer was seen today for shoulder injury and nausea.    Diagnoses and all orders for this visit:    Sprain of acromioclavicular joint, left, initial  encounter    Shoulder injury, left, initial encounter  -     XR Shoulder Left 2+ Views    Anxiety    Depression, unspecified depression type    Pt advised to get an appt with PCP referral provided ASAP so to follow and treat pt depression and anxiety. Pt also  advised to go to the ER or seek immediately medical attention if feeling worse and wanting to hurt self or others.   In regards to shoulder pain, Pt denies need for PT referral because he sees school PT tomorrow and will follow with him.  Advised rest and fluids; discussed appropriate otc sx tx for use prn.   Follow up with PCP or RTC if there are any new or worsening symptoms or if the symptoms are lasting longer than expected.  Patient/guardian expressed understanding and agreement with plan of care at time of discharge.     Discussed concerning s/s for which I have advised the patient to go immediately to the Emergency Department.           Colman Cater, NP  [facility]  11/26/2017  5:10 PM

## 2017-11-29 ENCOUNTER — Telehealth (INDEPENDENT_AMBULATORY_CARE_PROVIDER_SITE_OTHER): Payer: Self-pay

## 2017-11-29 NOTE — Telephone Encounter (Signed)
Called to check on patient after recent visit. Left a message to call back if any questions or concerns.

## 2018-01-05 ENCOUNTER — Emergency Department
Admission: EM | Admit: 2018-01-05 | Discharge: 2018-01-05 | Disposition: A | Discharge status: Home / Self Care | Attending: Family Medicine | Admitting: Family Medicine

## 2018-01-05 ENCOUNTER — Emergency Department

## 2018-01-05 DIAGNOSIS — R109 Unspecified abdominal pain: Secondary | ICD-10-CM

## 2018-01-05 DIAGNOSIS — R1031 Right lower quadrant pain: Secondary | ICD-10-CM | POA: Insufficient documentation

## 2018-01-05 DIAGNOSIS — R319 Hematuria, unspecified: Secondary | ICD-10-CM | POA: Insufficient documentation

## 2018-01-05 LAB — VH URINALYSIS WITH MICROSCOPIC IF INDICATED
Bilirubin, UA: NEGATIVE mg/dL
Glucose, UA: NEGATIVE mg/dL
Ketones UA: NEGATIVE
Leukocyte Esterase, UA: NEGATIVE Leu/uL
Nitrite, UA: NEGATIVE
Urine Specific Gravity: 1.023 (ref 1.001–1.040)
Urobilinogen, UA: 0.2 mg/dL
pH, Urine: 8.5 pH — ABNORMAL HIGH (ref 5.0–8.0)

## 2018-01-05 LAB — COMPREHENSIVE METABOLIC PANEL
ALT: 21 U/L (ref 0–55)
AST (SGOT): 22 U/L (ref 10–42)
Albumin/Globulin Ratio: 1.21 Ratio (ref 0.80–2.00)
Albumin: 4.3 gm/dL (ref 3.5–5.0)
Alkaline Phosphatase: 54 U/L (ref 40–145)
Anion Gap: 13.2 mMol/L (ref 7.0–18.0)
BUN / Creatinine Ratio: 10 Ratio (ref 10.0–30.0)
BUN: 10 mg/dL (ref 7–22)
Bilirubin, Total: 0.5 mg/dL (ref 0.1–1.2)
CO2: 26 mMol/L (ref 20.0–30.0)
Calcium: 9.4 mg/dL (ref 8.5–10.5)
Chloride: 104 mMol/L (ref 98–110)
Creatinine: 1 mg/dL (ref 0.80–1.30)
EGFR: 107 mL/min/{1.73_m2} (ref 60–150)
Globulin: 3.5 gm/dL (ref 2.0–4.0)
Glucose: 94 mg/dL (ref 71–99)
Osmolality Calculated: 276 mOsm/kg (ref 275–300)
Potassium: 4.2 mMol/L (ref 3.5–5.3)
Protein, Total: 7.8 gm/dL (ref 6.0–8.3)
Sodium: 139 mMol/L (ref 136–147)

## 2018-01-05 LAB — CBC AND DIFFERENTIAL
Basophils %: 0.9 % (ref 0.0–3.0)
Basophils Absolute: 0.1 10*3/uL (ref 0.0–0.3)
Eosinophils %: 4.9 % (ref 0.0–7.0)
Eosinophils Absolute: 0.3 10*3/uL (ref 0.0–0.8)
Hematocrit: 44.9 % (ref 39.0–52.5)
Hemoglobin: 15 gm/dL (ref 13.0–17.5)
Lymphocytes Absolute: 2.2 10*3/uL (ref 0.6–5.1)
Lymphocytes: 34.5 % (ref 15.0–46.0)
MCH: 31 pg (ref 28–35)
MCHC: 34 gm/dL (ref 32–36)
MCV: 92 fL (ref 80–100)
MPV: 6.5 fL (ref 6.0–10.0)
Monocytes Absolute: 0.5 10*3/uL (ref 0.1–1.7)
Monocytes: 7.8 % (ref 3.0–15.0)
Neutrophils %: 51.9 % (ref 42.0–78.0)
Neutrophils Absolute: 3.4 10*3/uL (ref 1.7–8.6)
PLT CT: 404 10*3/uL (ref 130–440)
RBC: 4.91 10*6/uL (ref 4.00–5.70)
RDW: 11 % (ref 11.0–14.0)
WBC: 6.5 10*3/uL (ref 4.0–11.0)

## 2018-01-05 LAB — CK: Creatine Kinase (CK): 279 U/L — ABNORMAL HIGH (ref 30–230)

## 2018-01-05 NOTE — ED Notes (Signed)
MD at bedside. 

## 2018-01-05 NOTE — ED Notes (Signed)
Signature pad not working . Understands.

## 2018-01-05 NOTE — Consults (Signed)
SBIRT Consult  Performed by: Sherene Sires, LCSW  Ordered by: Blima Dessert, MD  Assessment/Recommendations:  Alan Palmer was given a universal screen that was positive for tobacco use.   Patient reports that he started smoking cigarettes first and about a year ago switched to juuling to get the smoke out of his lungs.  He is athletic and involved in sports and exercises often.  He lives in a household of 5 friends who all juul throughout the day.  They have tried to quit before unsuccessfully.  On a readiness scale to make a change in his juuling, he self-reported to be a 4 (1-10).  Ten is totally ready to make a change.  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  Shared smoking cessation written material and encouraged patient and his friends to think of their health.      In total, 5 minutes of personal time was spent with this patient.

## 2018-01-05 NOTE — ED Provider Notes (Addendum)
Physician/Midlevel provider first contact with patient: 01/05/18 1119         History     Chief Complaint   Patient presents with   . Flank Pain     This is a 22 year old male patient who is presenting here today for right flank pain associated with hematuria.  Patient states right flank pain started 2 days ago, intermittent, 4/10 in severity, sharp and aching in nature, sporadic, nothing increases or decreases it, associated with intermittent microscopic hematuria.  Patient denies any fever or chills, nausea or vomiting, no abdominal pain, no constipation or diarrhea, no history of kidney stones, no similar events in the past.               Past Medical History:   Diagnosis Date   . No known health problems        Past Surgical History:   Procedure Laterality Date   . NO PAST SURGERIES         Family History   Problem Relation Age of Onset   . Hypertension Mother    . Hypertension Father        Social  Social History     Tobacco Use   . Smoking status: Current Some Day Smoker     Packs/day: 0.00   . Smokeless tobacco: Former Neurosurgeon     Quit date: 08/01/2015   . Tobacco comment: VAPES   Substance Use Topics   . Alcohol use: Yes     Alcohol/week: 0.0 standard drinks     Comment: occ.   . Drug use: No       .     Allergies   Allergen Reactions   . Fish Allergy Anaphylaxis   . Other Anaphylaxis   . Peanut Oil Hives and Anaphylaxis   . Peanut-Containing Drug Products Anaphylaxis     All nuts   . Shellfish-Derived Products Hives       Home Medications     Med List Status:  Pharmacy Completed Set By: Lily Lovings, PharmD at 01/05/2018 11:47 AM        Status Comment        01/05/2018 11:47 AM    Patient confirmed the only medication he has is an Epi Pen. No other medications including OTC medications as of 01/05/18.                EPIPEN 2-PAK 0.3 MG/0.3ML Solution Auto-injector     Inject 0.3 mg into the muscle as needed              Review of Systems   Constitutional: Negative for chills and fever.   Genitourinary:  Positive for flank pain and hematuria. Negative for decreased urine volume, difficulty urinating, discharge, dysuria, enuresis, frequency, genital sores, penile pain, penile swelling, scrotal swelling, testicular pain and urgency.   All other systems reviewed and are negative.      Physical Exam    BP: 148/88, Heart Rate: 79, Temp: 97.7 F (36.5 C), Resp Rate: 18, SpO2: 100 %, Weight: 77.1 kg    Physical Exam  Vitals signs and nursing note reviewed.   Constitutional:       General: He is not in acute distress.     Appearance: Normal appearance. He is normal weight. He is not ill-appearing, toxic-appearing or diaphoretic.   HENT:      Head: Atraumatic.   Eyes:      Extraocular Movements: Extraocular movements intact.   Cardiovascular:  Rate and Rhythm: Normal rate and regular rhythm.      Pulses: Normal pulses.      Heart sounds: Normal heart sounds. No murmur. No friction rub.   Pulmonary:      Effort: Pulmonary effort is normal. No respiratory distress.      Breath sounds: Normal breath sounds. No stridor. No wheezing, rhonchi or rales.   Chest:      Chest wall: No tenderness.   Abdominal:      General: Abdomen is flat.      Palpations: Abdomen is soft.      Tenderness: There is tenderness. There is right CVA tenderness. There is no left CVA tenderness, guarding or rebound.   Musculoskeletal: Normal range of motion.         General: No tenderness.   Neurological:      General: No focal deficit present.      Mental Status: He is alert and oriented to person, place, and time. Mental status is at baseline.      Cranial Nerves: No cranial nerve deficit.      Sensory: No sensory deficit.       No results found.  Results     Procedure Component Value Units Date/Time    Creatine Kinase (CK) [[id number]]  (Abnormal) Collected:  01/05/18 1128    Specimen:  Plasma Updated:  01/05/18 1241     Creatine Kinase (CK) 279 U/L     Urinalysis with Microscopic if Indicated [[id number]]  (Abnormal) Collected:  01/05/18 1129     Specimen:  Urine, Random Updated:  01/05/18 1202     Color, UA Red - Color may affect some analytes     Clarity, UA Cloudy     Specific Gravity, UR 1.023     pH, Urine 8.5 pH      Protein, UR 1+ mg/dL      Glucose, UA Negative mg/dL      Ketones UA Negative     Bilirubin, UA Negative mg/dL      Blood, UA 3+ mg/dL      Nitrite, UA Negative     Urobilinogen, UA 0.2 mg/dL      Leukocyte Esterase, UA Negative Leu/uL      UR Micro Performed     RBC, UA TNTC /hpf     Comprehensive metabolic panel [[id number]] Collected:  01/05/18 1129    Specimen:  Plasma Updated:  01/05/18 1200     Sodium 139 mMol/L      Potassium 4.2 mMol/L      Chloride 104 mMol/L      CO2 26.0 mMol/L      Calcium 9.4 mg/dL      Glucose 94 mg/dL      Creatinine 0.86 mg/dL      BUN 10 mg/dL      Protein, Total 7.8 gm/dL      Albumin 4.3 gm/dL      Alkaline Phosphatase 54 U/L      ALT 21 U/L      AST (SGOT) 22 U/L      Bilirubin, Total 0.5 mg/dL      Albumin/Globulin Ratio 1.21 Ratio      Anion Gap 13.2 mMol/L      BUN/Creatinine Ratio 10.0 Ratio      EGFR 107 mL/min/1.31m2      Osmolality Calculated 276 mOsm/kg      Globulin 3.5 gm/dL     CBC and differential [[id number]] Collected:  01/05/18 1129    Specimen:  Blood Updated:  01/05/18 1142     WBC 6.5 K/cmm      RBC 4.91 M/cmm      Hemoglobin 15.0 gm/dL      Hematocrit 73.7 %      MCV 92 fL      MCH 31 pg      MCHC 34 gm/dL      RDW 10.6 %      PLT CT 404 K/cmm      MPV 6.5 fL      NEUTROPHIL % 51.9 %      Lymphocytes 34.5 %      Monocytes 7.8 %      Eosinophils % 4.9 %      Basophils % 0.9 %      Neutrophils Absolute 3.4 K/cmm      Lymphocytes Absolute 2.2 K/cmm      Monocytes Absolute 0.5 K/cmm      Eosinophils Absolute 0.3 K/cmm      BASO Absolute 0.1 K/cmm           MDM and ED Course     ED Medication Orders (From admission, onward)    None             MDM  Number of Diagnoses or Management Options  Flank pain:   Hematuria, unspecified type:   Diagnosis management comments: Patient was offered  Toradol shot.  He declined.  I discussed with the patient his CT imaging.  Patient has no suggestion of urinary tract infection.  CT scan did not show any kidney stone.  There was a possibility of epiploic appendagitis.  I discussed with the patient, recommended him to follow-up with primary care physician the next 2 to 3 days.  Come back in case of worsening symptoms, any nausea, vomiting, diarrhea, abdominal pain, fever or chills, any worsening hematuria, at that point he will probably need an IV contrast.  Meanwhile he can take Tylenol as needed for pain if needed.  Patient verbalized understanding and was given the plan.       Amount and/or Complexity of Data Reviewed  Clinical lab tests: ordered and reviewed  Tests in the radiology section of CPT: ordered and reviewed    Patient Progress  Patient progress: stable                   Procedures    Clinical Impression & Disposition     Clinical Impression  Final diagnoses:   Flank pain   Hematuria, unspecified type        ED Disposition     ED Disposition Condition Date/Time Comment    Discharge  Mon Jan 05, 2018  1:06 PM Barnett Applebaum discharge to home/self care.    Condition at disposition: Stable           Discharge Medication List as of 01/05/2018  1:06 PM                    Blima Dessert, MD  01/05/18 1308       Blima Dessert, MD  01/12/18 [id number]

## 2018-01-05 NOTE — Discharge Instructions (Signed)
Flank Pain, Uncertain Cause  The flank is the area between your upper abdomen and your back. Pain there is often caused by a problem with your kidneys. It might be a kidney infection or a kidney stone. Other causes of flank pain include spinal arthritis, a pinched nerve from a back injury, or a back muscle strain or spasm.  The cause of your flank pain is not certain. You may need other tests.  Home care  Follow these tips when caring for yourself at home:   You may use acetaminophen or ibuprofen to control pain, unless your health care provider prescribed another medicine. If you have chronic liver or kidney disease, talk with your provider before taking these medicines. Also talk with your provider first if you've ever had a stomach ulcer or GI bleeding.   If the pain is coming from your muscles, you may get relief with ice or heat. During the first 2 days after the injury, put an ice pack on the painful area for 20 minutes every 2 to 4 hours. This will reduce swelling and pain. A hot shower, hot bath, or heating pad works well for a muscle spasm. You can start with ice, then switch to heat after 2 days. You might find that alternating ice and heat works well. Use the method that feels the best to you.  Follow-up care  Follow up with your healthcare provider if your symptoms don't get better over the next few days.  When to seek medical advice  Call your healthcare provider right awayif any of these happen:   Repeated vomiting   Fever of 100.85F (38C) or higher, or as directed by your health care provider   Flank pain that gets worse   Pain that spreads to the front of your belly (abdomen)   Dizziness, weakness, or fainting   Blood in your urine   Burning feeling when you urinate or the need to urinate often   Pain in one of your legs that gets worse   Numbness or weakness in a leg  StayWell last reviewed this educational content on 11/19/2014   2000-2019 [employer], [place]. [address] [place], [place] [address]. All rights reserved. This information is not intended as a substitute for professional medical care. Always follow your healthcare professional's instructions.          Blood in Urine (Hematuria)  Blood in your urine is called hematuria.Most of the time, the cause is not serious. But you should never ignore blood in the urine. Your healthcare provider can evaluate you to find the cause of the bleeding and treat it, if needed.  Types of hematuria   Gross hematuria. This means that the blood can be seen by the naked eye. The urine may look pinkish, brownish, or bright red.   Microscopic hematuria. This means that the urine is clear, but blood cells can be seen when urine is looked at under a microscope or tested in a lab.  Both types of hematuria can have the same causes. Neither one is more serious than the other. With either type, you may not have any other symptoms at all. Or you may have other symptoms such as:   Pain, pressure, or burning when you urinate   Belly pain   Back pain  No matter how much blood is found, the cause of the bleeding needs to be identified.  What causes hematuria?  Causes of hematuria vary. They include things such as:  Injury   Strenuous exercise   Infection of the bladder, kidney, or prostate   Menstruation  Other reasons people may have blood in their urine are more serious. They include:     Blood-clotting disorders   Bladder or kidney cancer   Sickle cell disease   Inflammation of the kidney, urethra, bladder, or prostate  Many treatments are available for blood in the urine, depending on the cause.  Diagnosing hematuria  Your healthcare provider will first confirm that blood is in your urine. He or she will also take your health history and give you a physical exam. Then other tests are done to find exactly where the blood is coming from and why. Your provider will decide which tests will best figure out the cause of your hematuria. These are  some common tests that may be done:   Lab tests (may include urinalysis, a urine culture, a urine cytology, and blood tests)   Cystoscopy   CT or CT urography   MRI or MR urography   Ultrasound of the kidney   Kidney biopsy  StayWell last reviewed this educational content on 07/19/2017   2000-2019 [employer], [place]. [address] [place], [place] [address]. All rights reserved. This information is not intended as a substitute for professional medical care. Always follow your healthcare professional's instructions.

## 2018-01-07 ENCOUNTER — Emergency Department
Admission: EM | Admit: 2018-01-07 | Discharge: 2018-01-08 | Disposition: A | Discharge status: Home / Self Care | Attending: Emergency Medicine | Admitting: Emergency Medicine

## 2018-01-07 DIAGNOSIS — R319 Hematuria, unspecified: Secondary | ICD-10-CM | POA: Insufficient documentation

## 2018-01-08 ENCOUNTER — Emergency Department
Admission: RE | Admit: 2018-01-08 | Discharge: 2018-01-08 | Disposition: A | Discharge status: Home / Self Care | Source: Ambulatory Visit

## 2018-01-08 ENCOUNTER — Emergency Department
Admission: RE | Admit: 2018-01-08 | Discharge: 2018-01-08 | Disposition: A | Discharge status: Home / Self Care | Source: Ambulatory Visit | Attending: Emergency Medicine | Admitting: Emergency Medicine

## 2018-01-08 ENCOUNTER — Other Ambulatory Visit: Payer: Self-pay | Admitting: Emergency Medicine

## 2018-01-08 DIAGNOSIS — R319 Hematuria, unspecified: Secondary | ICD-10-CM

## 2018-01-08 LAB — CBC AND DIFFERENTIAL
Basophils %: 1 % (ref 0.0–3.0)
Basophils Absolute: 0.1 10*3/uL (ref 0.0–0.3)
Eosinophils %: 3.3 % (ref 0.0–7.0)
Eosinophils Absolute: 0.3 10*3/uL (ref 0.0–0.8)
Hematocrit: 47.8 % (ref 39.0–52.5)
Hemoglobin: 15.8 gm/dL (ref 13.0–17.5)
Lymphocytes Absolute: 2.4 10*3/uL (ref 0.6–5.1)
Lymphocytes: 26.6 % (ref 15.0–46.0)
MCH: 30 pg (ref 28–35)
MCHC: 33 gm/dL (ref 32–36)
MCV: 92 fL (ref 80–100)
MPV: 6.5 fL (ref 6.0–10.0)
Monocytes Absolute: 0.6 10*3/uL (ref 0.1–1.7)
Monocytes: 7.1 % (ref 3.0–15.0)
Neutrophils %: 61.9 % (ref 42.0–78.0)
Neutrophils Absolute: 5.6 10*3/uL (ref 1.7–8.6)
PLT CT: 432 10*3/uL (ref 130–440)
RBC: 5.2 10*6/uL (ref 4.00–5.70)
RDW: 11 % (ref 11.0–14.0)
WBC: 9 10*3/uL (ref 4.0–11.0)

## 2018-01-08 LAB — COMPREHENSIVE METABOLIC PANEL
ALT: 16 U/L (ref 0–55)
AST (SGOT): 18 U/L (ref 10–42)
Albumin/Globulin Ratio: 1.15 Ratio (ref 0.80–2.00)
Albumin: 4.3 gm/dL (ref 3.5–5.0)
Alkaline Phosphatase: 57 U/L (ref 40–145)
Anion Gap: 14.3 mMol/L (ref 7.0–18.0)
BUN / Creatinine Ratio: 10 Ratio (ref 10.0–30.0)
BUN: 10 mg/dL (ref 7–22)
Bilirubin, Total: 0.3 mg/dL (ref 0.1–1.2)
CO2: 27 mMol/L (ref 20.0–30.0)
Calcium: 9.7 mg/dL (ref 8.5–10.5)
Chloride: 104 mMol/L (ref 98–110)
Creatinine: 1 mg/dL (ref 0.80–1.30)
EGFR: 107 mL/min/{1.73_m2} (ref 60–150)
Globulin: 3.8 gm/dL (ref 2.0–4.0)
Glucose: 87 mg/dL (ref 71–99)
Osmolality Calculated: 280 mOsm/kg (ref 275–300)
Potassium: 4.3 mMol/L (ref 3.5–5.3)
Protein, Total: 8.1 gm/dL (ref 6.0–8.3)
Sodium: 141 mMol/L (ref 136–147)

## 2018-01-08 LAB — VH URINALYSIS WITH MICROSCOPIC IF INDICATED
Bilirubin, UA: NEGATIVE mg/dL
Glucose, UA: NEGATIVE mg/dL
Ictotest: NEGATIVE
Nitrite, UA: NEGATIVE
Urine Specific Gravity: 1.023 (ref 1.001–1.040)
Urobilinogen, UA: 0.2 mg/dL
pH, Urine: 6 pH (ref 5.0–8.0)

## 2018-01-08 LAB — CK: Creatine Kinase (CK): 121 U/L (ref 30–230)

## 2018-01-08 MED ORDER — IOHEXOL 300 MG/ML IJ SOLN
100.00 mL | Freq: Once | INTRAMUSCULAR | Status: AC | PRN
Start: 2018-01-08 — End: 2018-01-08
  Administered 2018-01-08: 10:00:00 100 mL via INTRAVENOUS
  Filled 2018-01-08: qty 100

## 2018-01-21 NOTE — ED Provider Notes (Signed)
Physician/Midlevel provider first contact with patient: 01/21/18 0913         History   No chief complaint on file.  this patient is here with continued hematuria. See no department 2 days prior, blood work urinalysis and CT scan done at that time. Negative for any abdominal pathology. He reports persistent symptoms he doesn't report any pain. No fevers or chills are reported. No dysuria.          Past Medical History:   Diagnosis Date   . No known health problems        Past Surgical History:   Procedure Laterality Date   . NO PAST SURGERIES         Family History   Problem Relation Age of Onset   . Hypertension Mother    . Hypertension Father        Social  Social History     Tobacco Use   . Smoking status: Current Some Day Smoker     Packs/day: 0.00   . Smokeless tobacco: Former Neurosurgeon     Quit date: 08/01/2015   . Tobacco comment: VAPES   Substance Use Topics   . Alcohol use: Yes     Alcohol/week: 0.0 standard drinks     Comment: occ.   . Drug use: No       .     Allergies   Allergen Reactions   . Fish Allergy Anaphylaxis   . Other Anaphylaxis   . Peanut Oil Hives and Anaphylaxis   . Peanut-Containing Drug Products Anaphylaxis     All nuts   . Shellfish-Derived Products Hives       Home Medications             EPIPEN 2-PAK 0.3 MG/0.3ML Solution Auto-injector     Inject 0.3 mg into the muscle as needed              Review of Systems   Constitutional: Negative for chills and fever.   Respiratory: Negative for shortness of breath.    Gastrointestinal: Negative for abdominal pain, nausea and vomiting.   Genitourinary: Positive for hematuria. Negative for flank pain.   Musculoskeletal: Negative for back pain.   All other systems reviewed and are negative.      Physical Exam          Physical Exam  Vitals signs and nursing note reviewed.   Constitutional:       General: He is in acute distress.      Appearance: Normal appearance. He is not toxic-appearing.   HENT:      Head: Normocephalic and atraumatic.      Nose:  Nose normal.      Mouth/Throat:      Mouth: Mucous membranes are moist.   Eyes:      Extraocular Movements: Extraocular movements intact.      Pupils: Pupils are equal, round, and reactive to light.   Neck:      Musculoskeletal: Normal range of motion and neck supple.   Cardiovascular:      Rate and Rhythm: Normal rate and regular rhythm.   Pulmonary:      Effort: Pulmonary effort is normal.      Breath sounds: Normal breath sounds.   Abdominal:      General: Abdomen is flat.      Palpations: Abdomen is soft.      Tenderness: There is no tenderness.   Musculoskeletal: Normal range of motion.  Right lower leg: No edema.      Left lower leg: No edema.   Skin:     General: Skin is warm and dry.      Capillary Refill: Capillary refill takes less than 2 seconds.   Neurological:      General: No focal deficit present.      Mental Status: He is alert and oriented to person, place, and time.   Psychiatric:         Mood and Affect: Mood normal.         Behavior: Behavior normal.         Results     Procedure Component Value Units Date/Time    Urine Culture [[id number]] Collected:  01/07/18 1525    Specimen:  Urine Updated:  01/09/18 0952    Narrative:       Specimen: Urine Specimens  Collected: 01/07/2018 15:25     Status: Final      Last Updated: 01/09/2018 09:52                Culture Result (Final)      No Growth Final          LABORATORY REFERENCE AND MISC SCAN [[id number]] Resulted:  01/08/18 1522     Updated:  01/08/18 1522    Urinalysis with Microscopic if Indicated [[id number]]  (Abnormal) Collected:  01/07/18 1525    Specimen:  Urine, Random Updated:  01/08/18 1042     Color, UA Red - Color may affect some analytes     Clarity, UA Turbid     Specific Gravity, UR 1.023     pH, Urine 6.0 pH      Protein, UR 2+ mg/dL      Glucose, UA Negative mg/dL      Ketones UA Trace     Bilirubin, UA Negative mg/dL      Blood, UA 3+ mg/dL      Nitrite, UA Negative     Urobilinogen, UA 0.2 mg/dL      Leukocyte Esterase, UA 1+  Leu/uL      UR Micro Performed     WBC, UA 3-4 /hpf      RBC, UA TNTC /hpf      Bacteria, UA Many /hpf      Ictotest Negative    Comprehensive metabolic panel [[id number]] Collected:  01/07/18 1525    Specimen:  Plasma Updated:  01/08/18 1038     Sodium 141 mMol/L      Potassium 4.3 mMol/L      Chloride 104 mMol/L      CO2 27.0 mMol/L      Calcium 9.7 mg/dL      Glucose 87 mg/dL      Creatinine 6.29 mg/dL      BUN 10 mg/dL      Protein, Total 8.1 gm/dL      Albumin 4.3 gm/dL      Alkaline Phosphatase 57 U/L      ALT 16 U/L      AST (SGOT) 18 U/L      Bilirubin, Total 0.3 mg/dL      Albumin/Globulin Ratio 1.15 Ratio      Anion Gap 14.3 mMol/L      BUN/Creatinine Ratio 10.0 Ratio      EGFR 107 mL/min/1.28m2      Osmolality Calculated 280 mOsm/kg      Globulin 3.8 gm/dL     Creatine Kinase (CK) [[id number]] Collected:  01/07/18 1525    Specimen:  Plasma Updated:  01/08/18 1038     Creatine Kinase (CK) 121 U/L     CBC and differential [[id number]] Collected:  01/07/18 1525    Specimen:  Blood Updated:  01/08/18 1037     WBC 9.0 K/cmm      RBC 5.20 M/cmm      Hemoglobin 15.8 gm/dL      Hematocrit 40.9 %      MCV 92 fL      MCH 30 pg      MCHC 33 gm/dL      RDW 81.1 %      PLT CT 432 K/cmm      MPV 6.5 fL      NEUTROPHIL % 61.9 %      Lymphocytes 26.6 %      Monocytes 7.1 %      Eosinophils % 3.3 %      Basophils % 1.0 %      Neutrophils Absolute 5.6 K/cmm      Lymphocytes Absolute 2.4 K/cmm      Monocytes Absolute 0.6 K/cmm      Eosinophils Absolute 0.3 K/cmm      BASO Absolute 0.1 K/cmm       Ct Abdomen Pelvis Dry  (stone)    Result Date: 01/05/2018  IMPRESSION: Noncontrast CT of the abdomen and pelvis of limited value, without oral or IV contrast in this patient who is extremely thin, without intra-abdominal fat to provide intrinsic contrast. Younger, thinner patients less intra-abdominal fat, and IV/oral contrast nearly always provides better results. No evidence of obstructive uropathy or stones. The appendix is  adequately visualized and is normal. The gallbladder appears unremarkable. There is an area of inflamed pericolonic fat along the ascending colon, remote from the cecum. The wall of the colon appears relatively thin therefore this is not thought to represent colitis or inflammatory bowel disease. The most likely diagnosis would be an epiploic appendagitis. Because epiploica appendagitis tends to occur in older patients, consider repeat CT with IV and oral contrast if symptoms persist ReadingStation:[id number]    Ct Abdomen Pelvis W Iv/ Wo Po Cont    Result Date: 01/08/2018  Normal CT abdomen and pelvis. ReadingStation:[id number]    MDM and ED Course     ED Medication Orders (From admission, onward)    None             MDM  Number of Diagnoses or Management Options  Hematuria, unspecified type:   Diagnosis management comments: Laboratory data as above, persistent abnormal urinalysis, also some proteinuria. Really unknown etiology of this problem. He hasn't reported any recent URI or throat symptoms, Re: thinking of Post streptococcal glomerulonephritis in this young healthy patient.    I'm going to start him on antibiotic and I stressed the importance of very prompt follow-up I gave him local numbers although he doesn't live in the area he is in school, I stressed that he needs to either go home immediately and seek follow-up with his family doctor in his local area or follow up with the numbers that I gave him as soon as possible.    Prescription for Keflex given.       Amount and/or Complexity of Data Reviewed  Clinical lab tests: reviewed  Tests in the radiology section of CPT: reviewed  Review and summarize past medical records: yes    Risk of Complications, Morbidity, and/or Mortality  Presenting problems: high  Management options: high  Procedures    Clinical Impression & Disposition     Clinical Impression  Final diagnoses:   Hematuria, unspecified type        ED Disposition     None            Discharge Medication List as of 01/08/2018  3:05 AM                    Oretha Ellis, MD  01/21/18 1212

## 2018-01-21 NOTE — Discharge Instructions (Signed)
Hematuria: Possible Causes    Many things can lead to blood in the urine (hematuria). The blood may be seen with the eye (macroscopic or gross hematuria). Or it may only be seen when the urine is looked at under a microscope (microscopic hematuria). Often no cause for the blood can be found. This is called idiopathic hematuria. Here are some of the most common causes of blood in the urine:    Kidney or bladder stones. These are collections of crystals. They form in the urine. Stones may be found anywhere in the urinary tract. But they form most often in the kidneys or bladder. In addition to blood in the urine, they can cause severe pain.   BPH (benign prostatic hyperplasia). This is enlargement of the prostate gland. It happens as men age. BPH often causes problems with urination. It sometimes causes blood in the urine.   Urinary tract infection (UTI). This is due to bacteria growing in the urinary tract. It can cause blood in the urine. Other symptoms include burning or pain with urination. You may need to urinate often or urgently. You may also have a fever.   Damage to the urinary tract may cause blood in the urine. This damage may be due to a blow or accident. It may also result from the use of a urinary catheter. Very hard exercise may sometimes irritate the urinary tract and cause bleeding.   Cancer may occur anywhere in the urinary tract. A tumor may sometimes cause no symptoms other than bleeding.  Other possible causes of bleeding include:    Prostate gland infection (prostatitis)   Taking anticoagulants   Blockage in the urinary tract   Kidney disease or inflammation   Cystic diseases of the kidneys   Sickle cell anemia   Vigorous exercise   Endometriosis  [employer] last reviewed this educational content on 08/18/2017   2000-2019 [employer] [address], [place] [address]. All rights reserved. This information is not intended as a substitute for professional medical  care. Always follow your healthcare professional's instructions.
